# Patient Record
Sex: Female | Born: 2000
Health system: Southern US, Community
[De-identification: ages and names within clinical notes are randomized; demographics above are authoritative.]

## PROBLEM LIST (undated history)

## (undated) DIAGNOSIS — T4145XA Adverse effect of unspecified anesthetic, initial encounter: Secondary | ICD-10-CM

## (undated) DIAGNOSIS — F419 Anxiety disorder, unspecified: Secondary | ICD-10-CM

## (undated) DIAGNOSIS — T8859XA Other complications of anesthesia, initial encounter: Secondary | ICD-10-CM

## (undated) HISTORY — PX: WISDOM TOOTH EXTRACTION: SHX21

---
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
## 2018-07-14 DIAGNOSIS — R51 Headache: Secondary | ICD-10-CM | POA: Diagnosis not present

## 2018-07-14 DIAGNOSIS — H5203 Hypermetropia, bilateral: Secondary | ICD-10-CM | POA: Diagnosis not present

## 2019-11-15 DIAGNOSIS — T148XXA Other injury of unspecified body region, initial encounter: Secondary | ICD-10-CM | POA: Diagnosis not present

## 2019-11-17 DIAGNOSIS — M25632 Stiffness of left wrist, not elsewhere classified: Secondary | ICD-10-CM | POA: Diagnosis not present

## 2019-11-17 DIAGNOSIS — M25532 Pain in left wrist: Secondary | ICD-10-CM | POA: Diagnosis not present

## 2019-11-19 DIAGNOSIS — M25532 Pain in left wrist: Secondary | ICD-10-CM | POA: Diagnosis not present

## 2019-11-19 DIAGNOSIS — R29898 Other symptoms and signs involving the musculoskeletal system: Secondary | ICD-10-CM | POA: Diagnosis not present

## 2019-11-19 DIAGNOSIS — M25632 Stiffness of left wrist, not elsewhere classified: Secondary | ICD-10-CM | POA: Diagnosis not present

## 2019-11-30 DIAGNOSIS — R29898 Other symptoms and signs involving the musculoskeletal system: Secondary | ICD-10-CM | POA: Diagnosis not present

## 2019-11-30 DIAGNOSIS — M25532 Pain in left wrist: Secondary | ICD-10-CM | POA: Diagnosis not present

## 2019-11-30 DIAGNOSIS — M25632 Stiffness of left wrist, not elsewhere classified: Secondary | ICD-10-CM | POA: Diagnosis not present

## 2019-12-13 DIAGNOSIS — M25532 Pain in left wrist: Secondary | ICD-10-CM | POA: Diagnosis not present

## 2019-12-13 DIAGNOSIS — R29898 Other symptoms and signs involving the musculoskeletal system: Secondary | ICD-10-CM | POA: Diagnosis not present

## 2019-12-13 DIAGNOSIS — M25632 Stiffness of left wrist, not elsewhere classified: Secondary | ICD-10-CM | POA: Diagnosis not present

## 2019-12-21 DIAGNOSIS — M67432 Ganglion, left wrist: Secondary | ICD-10-CM | POA: Diagnosis not present

## 2019-12-28 ENCOUNTER — Other Ambulatory Visit: Payer: Self-pay | Admitting: Orthopedic Surgery

## 2019-12-28 DIAGNOSIS — R29898 Other symptoms and signs involving the musculoskeletal system: Secondary | ICD-10-CM | POA: Diagnosis not present

## 2019-12-28 DIAGNOSIS — M25632 Stiffness of left wrist, not elsewhere classified: Secondary | ICD-10-CM | POA: Diagnosis not present

## 2019-12-28 DIAGNOSIS — M67432 Ganglion, left wrist: Secondary | ICD-10-CM | POA: Diagnosis not present

## 2019-12-28 DIAGNOSIS — M25532 Pain in left wrist: Secondary | ICD-10-CM | POA: Diagnosis not present

## 2019-12-29 ENCOUNTER — Ambulatory Visit: Payer: BC Managed Care – PPO | Attending: Internal Medicine

## 2019-12-29 DIAGNOSIS — Z20822 Contact with and (suspected) exposure to covid-19: Secondary | ICD-10-CM

## 2019-12-30 LAB — NOVEL CORONAVIRUS, NAA: SARS-CoV-2, NAA: NOT DETECTED

## 2019-12-31 ENCOUNTER — Other Ambulatory Visit: Payer: Self-pay

## 2019-12-31 ENCOUNTER — Ambulatory Visit
Admission: RE | Admit: 2019-12-31 | Discharge: 2019-12-31 | Disposition: A | Discharge status: Home / Self Care | Payer: BC Managed Care – PPO | Source: Ambulatory Visit | Attending: Orthopedic Surgery | Admitting: Orthopedic Surgery

## 2019-12-31 ENCOUNTER — Other Ambulatory Visit: Payer: Self-pay | Admitting: Orthopedic Surgery

## 2019-12-31 DIAGNOSIS — M67432 Ganglion, left wrist: Secondary | ICD-10-CM

## 2020-06-30 DIAGNOSIS — H5203 Hypermetropia, bilateral: Secondary | ICD-10-CM | POA: Diagnosis not present

## 2020-06-30 DIAGNOSIS — M67432 Ganglion, left wrist: Secondary | ICD-10-CM | POA: Diagnosis not present

## 2020-07-17 ENCOUNTER — Other Ambulatory Visit: Payer: Self-pay

## 2020-07-17 ENCOUNTER — Encounter (HOSPITAL_BASED_OUTPATIENT_CLINIC_OR_DEPARTMENT_OTHER): Payer: Self-pay | Admitting: Orthopedic Surgery

## 2020-07-19 ENCOUNTER — Other Ambulatory Visit: Payer: Self-pay | Admitting: Orthopedic Surgery

## 2020-07-21 ENCOUNTER — Encounter (HOSPITAL_BASED_OUTPATIENT_CLINIC_OR_DEPARTMENT_OTHER): Payer: Self-pay | Admitting: Orthopedic Surgery

## 2020-07-21 ENCOUNTER — Other Ambulatory Visit (HOSPITAL_COMMUNITY)
Admission: RE | Admit: 2020-07-21 | Discharge: 2020-07-21 | Disposition: A | Discharge status: Home / Self Care | Payer: BC Managed Care – PPO | Source: Ambulatory Visit | Attending: Orthopedic Surgery | Admitting: Orthopedic Surgery

## 2020-07-21 DIAGNOSIS — Z20822 Contact with and (suspected) exposure to covid-19: Secondary | ICD-10-CM | POA: Diagnosis not present

## 2020-07-21 DIAGNOSIS — Z01812 Encounter for preprocedural laboratory examination: Secondary | ICD-10-CM | POA: Diagnosis not present

## 2020-07-21 LAB — SARS CORONAVIRUS 2 (TAT 6-24 HRS): SARS Coronavirus 2: NEGATIVE

## 2020-07-21 NOTE — Progress Notes (Signed)

## 2020-07-24 ENCOUNTER — Encounter (HOSPITAL_BASED_OUTPATIENT_CLINIC_OR_DEPARTMENT_OTHER)
Admission: RE | Disposition: A | Discharge status: Home / Self Care | Payer: Self-pay | Source: Home / Self Care | Attending: Orthopedic Surgery

## 2020-07-24 ENCOUNTER — Ambulatory Visit (HOSPITAL_BASED_OUTPATIENT_CLINIC_OR_DEPARTMENT_OTHER): Payer: BC Managed Care – PPO | Admitting: Anesthesiology

## 2020-07-24 ENCOUNTER — Encounter (HOSPITAL_BASED_OUTPATIENT_CLINIC_OR_DEPARTMENT_OTHER): Payer: Self-pay | Admitting: Orthopedic Surgery

## 2020-07-24 ENCOUNTER — Ambulatory Visit (HOSPITAL_BASED_OUTPATIENT_CLINIC_OR_DEPARTMENT_OTHER)
Admission: RE | Admit: 2020-07-24 | Discharge: 2020-07-24 | Disposition: A | Discharge status: Home / Self Care | Payer: BC Managed Care – PPO | Attending: Orthopedic Surgery | Admitting: Orthopedic Surgery

## 2020-07-24 ENCOUNTER — Other Ambulatory Visit: Payer: Self-pay

## 2020-07-24 DIAGNOSIS — M67432 Ganglion, left wrist: Secondary | ICD-10-CM | POA: Insufficient documentation

## 2020-07-24 DIAGNOSIS — Z79899 Other long term (current) drug therapy: Secondary | ICD-10-CM | POA: Insufficient documentation

## 2020-07-24 DIAGNOSIS — F419 Anxiety disorder, unspecified: Secondary | ICD-10-CM | POA: Insufficient documentation

## 2020-07-24 HISTORY — DX: Other complications of anesthesia, initial encounter: T88.59XA

## 2020-07-24 HISTORY — DX: Anxiety disorder, unspecified: F41.9

## 2020-07-24 HISTORY — PX: MASS EXCISION: SHX2000

## 2020-07-24 LAB — POCT PREGNANCY, URINE: Preg Test, Ur: NEGATIVE

## 2020-07-24 SURGERY — EXCISION MASS
Anesthesia: General | Site: Wrist | Laterality: Left

## 2020-07-24 MED ORDER — CEFAZOLIN SODIUM-DEXTROSE 2-4 GM/100ML-% IV SOLN
2.0000 g | INTRAVENOUS | Status: AC
  Administered 2020-07-24: 2 g via INTRAVENOUS

## 2020-07-24 MED ORDER — FENTANYL CITRATE (PF) 100 MCG/2ML IJ SOLN
INTRAMUSCULAR | Status: AC
  Filled 2020-07-24: qty 2

## 2020-07-24 MED ORDER — MIDAZOLAM HCL 2 MG/2ML IJ SOLN
INTRAMUSCULAR | Status: AC
  Filled 2020-07-24: qty 2

## 2020-07-24 MED ORDER — ONDANSETRON HCL 4 MG/2ML IJ SOLN
INTRAMUSCULAR | Status: AC
  Filled 2020-07-24: qty 2

## 2020-07-24 MED ORDER — LIDOCAINE HCL (CARDIAC) PF 100 MG/5ML IV SOSY
PREFILLED_SYRINGE | INTRAVENOUS | Status: DC | PRN
  Administered 2020-07-24: 40 mg via INTRAVENOUS

## 2020-07-24 MED ORDER — LIDOCAINE 4 % EX CREA
TOPICAL_CREAM | CUTANEOUS | Status: AC
  Filled 2020-07-24: qty 5

## 2020-07-24 MED ORDER — PROPOFOL 10 MG/ML IV BOLUS
INTRAVENOUS | Status: AC
  Filled 2020-07-24: qty 20

## 2020-07-24 MED ORDER — MIDAZOLAM HCL 2 MG/2ML IJ SOLN
2.0000 mg | Freq: Once | INTRAMUSCULAR | Status: AC
  Administered 2020-07-24: 2 mg via INTRAVENOUS

## 2020-07-24 MED ORDER — LACTATED RINGERS IV SOLN: INTRAVENOUS | Status: DC

## 2020-07-24 MED ORDER — ONDANSETRON HCL 4 MG/2ML IJ SOLN
INTRAMUSCULAR | Status: DC | PRN
  Administered 2020-07-24: 4 mg via INTRAVENOUS

## 2020-07-24 MED ORDER — PROPOFOL 10 MG/ML IV BOLUS
INTRAVENOUS | Status: DC | PRN
  Administered 2020-07-24: 150 mg via INTRAVENOUS

## 2020-07-24 MED ORDER — FENTANYL CITRATE (PF) 100 MCG/2ML IJ SOLN
INTRAMUSCULAR | Status: DC | PRN
  Administered 2020-07-24 (×2): 50 ug via INTRAVENOUS

## 2020-07-24 MED ORDER — KETOROLAC TROMETHAMINE 30 MG/ML IJ SOLN
INTRAMUSCULAR | Status: DC | PRN
  Administered 2020-07-24: 30 mg via INTRAVENOUS

## 2020-07-24 MED ORDER — CEFAZOLIN SODIUM-DEXTROSE 2-4 GM/100ML-% IV SOLN
INTRAVENOUS | Status: AC
  Filled 2020-07-24: qty 100

## 2020-07-24 MED ORDER — BUPIVACAINE HCL (PF) 0.25 % IJ SOLN
INTRAMUSCULAR | Status: DC | PRN
  Administered 2020-07-24: 6 mL

## 2020-07-24 MED ORDER — HYDROCODONE-ACETAMINOPHEN 5-325 MG PO TABS: ORAL_TABLET | ORAL | 0 refills | Status: DC

## 2020-07-24 SURGICAL SUPPLY — 59 items
APL PRP STRL LF DISP 70% ISPRP (MISCELLANEOUS) ×1
APL SKNCLS STERI-STRIP NONHPOA (GAUZE/BANDAGES/DRESSINGS) ×1
BENZOIN TINCTURE PRP APPL 2/3 (GAUZE/BANDAGES/DRESSINGS) ×1 IMPLANT
BLADE MINI RND TIP GREEN BEAV (BLADE) IMPLANT
BLADE SURG 15 STRL LF DISP TIS (BLADE) ×2 IMPLANT
BLADE SURG 15 STRL SS (BLADE) ×4
BNDG CMPR 9X4 STRL LF SNTH (GAUZE/BANDAGES/DRESSINGS)
BNDG COHESIVE 1X5 TAN STRL LF (GAUZE/BANDAGES/DRESSINGS) IMPLANT
BNDG COHESIVE 2X5 TAN STRL LF (GAUZE/BANDAGES/DRESSINGS) IMPLANT
BNDG CONFORM 2 STRL LF (GAUZE/BANDAGES/DRESSINGS) IMPLANT
BNDG ELASTIC 2X5.8 VLCR STR LF (GAUZE/BANDAGES/DRESSINGS) IMPLANT
BNDG ELASTIC 3X5.8 VLCR STR LF (GAUZE/BANDAGES/DRESSINGS) ×1 IMPLANT
BNDG ESMARK 4X9 LF (GAUZE/BANDAGES/DRESSINGS) IMPLANT
BNDG GAUZE 1X2.1 STRL (MISCELLANEOUS) IMPLANT
BNDG GAUZE ELAST 4 BULKY (GAUZE/BANDAGES/DRESSINGS) ×1 IMPLANT
BNDG PLASTER X FAST 3X3 WHT LF (CAST SUPPLIES) IMPLANT
BNDG PLSTR 9X3 FST ST WHT (CAST SUPPLIES)
CHLORAPREP W/TINT 26 (MISCELLANEOUS) ×2 IMPLANT
CORD BIPOLAR FORCEPS 12FT (ELECTRODE) ×2 IMPLANT
COVER BACK TABLE 60X90IN (DRAPES) ×2 IMPLANT
COVER MAYO STAND STRL (DRAPES) ×2 IMPLANT
COVER WAND RF STERILE (DRAPES) IMPLANT
CUFF TOURN SGL QUICK 18X4 (TOURNIQUET CUFF) ×2 IMPLANT
DRAPE EXTREMITY T 121X128X90 (DISPOSABLE) ×2 IMPLANT
DRAPE SURG 17X23 STRL (DRAPES) ×2 IMPLANT
DRSG PAD ABDOMINAL 8X10 ST (GAUZE/BANDAGES/DRESSINGS) ×1 IMPLANT
GAUZE SPONGE 4X4 12PLY STRL (GAUZE/BANDAGES/DRESSINGS) ×2 IMPLANT
GAUZE XEROFORM 1X8 LF (GAUZE/BANDAGES/DRESSINGS) ×2 IMPLANT
GLOVE BIO SURGEON STRL SZ7.5 (GLOVE) ×2 IMPLANT
GLOVE BIOGEL PI IND STRL 7.0 (GLOVE) IMPLANT
GLOVE BIOGEL PI IND STRL 8 (GLOVE) ×1 IMPLANT
GLOVE BIOGEL PI INDICATOR 7.0 (GLOVE) ×2
GLOVE BIOGEL PI INDICATOR 8 (GLOVE) ×1
GLOVE ECLIPSE 6.5 STRL STRAW (GLOVE) ×1 IMPLANT
GOWN STRL REUS W/ TWL LRG LVL3 (GOWN DISPOSABLE) ×1 IMPLANT
GOWN STRL REUS W/TWL LRG LVL3 (GOWN DISPOSABLE) ×2
GOWN STRL REUS W/TWL XL LVL3 (GOWN DISPOSABLE) ×2 IMPLANT
NDL HYPO 25X1 1.5 SAFETY (NEEDLE) ×1 IMPLANT
NEEDLE HYPO 25X1 1.5 SAFETY (NEEDLE) ×2 IMPLANT
NS IRRIG 1000ML POUR BTL (IV SOLUTION) ×2 IMPLANT
PACK BASIN DAY SURGERY FS (CUSTOM PROCEDURE TRAY) ×2 IMPLANT
PAD CAST 3X4 CTTN HI CHSV (CAST SUPPLIES) IMPLANT
PAD CAST 4YDX4 CTTN HI CHSV (CAST SUPPLIES) IMPLANT
PADDING CAST ABS 4INX4YD NS (CAST SUPPLIES)
PADDING CAST ABS COTTON 4X4 ST (CAST SUPPLIES) ×1 IMPLANT
PADDING CAST COTTON 3X4 STRL (CAST SUPPLIES)
PADDING CAST COTTON 4X4 STRL (CAST SUPPLIES)
STOCKINETTE 4X48 STRL (DRAPES) ×2 IMPLANT
STRIP CLOSURE SKIN 1/2X4 (GAUZE/BANDAGES/DRESSINGS) ×1 IMPLANT
SUT ETHILON 3 0 PS 1 (SUTURE) IMPLANT
SUT ETHILON 4 0 PS 2 18 (SUTURE) ×1 IMPLANT
SUT ETHILON 5 0 P 3 18 (SUTURE)
SUT MNCRL AB 4-0 PS2 18 (SUTURE) ×1 IMPLANT
SUT NYLON ETHILON 5-0 P-3 1X18 (SUTURE) IMPLANT
SUT VIC AB 4-0 P2 18 (SUTURE) ×1 IMPLANT
SYR BULB EAR ULCER 3OZ GRN STR (SYRINGE) ×2 IMPLANT
SYR CONTROL 10ML LL (SYRINGE) ×2 IMPLANT
TOWEL GREEN STERILE FF (TOWEL DISPOSABLE) ×4 IMPLANT
UNDERPAD 30X36 HEAVY ABSORB (UNDERPADS AND DIAPERS) ×2 IMPLANT

## 2020-07-24 NOTE — Anesthesia Procedure Notes (Signed)
Procedure Name: LMA Insertion Date/Time: 07/24/2020 2:11 PM Performed by: Burna Cash, CRNA Pre-anesthesia Checklist: Patient identified, Emergency Drugs available, Suction available and Patient being monitored Patient Re-evaluated:Patient Re-evaluated prior to induction Oxygen Delivery Method: Circle system utilized Preoxygenation: Pre-oxygenation with 100% oxygen Induction Type: IV induction Ventilation: Mask ventilation without difficulty LMA: LMA inserted LMA Size: 4.0 Number of attempts: 1 Airway Equipment and Method: Bite block Placement Confirmation: positive ETCO2 Tube secured with: Tape Dental Injury: Teeth and Oropharynx as per pre-operative assessment

## 2020-07-24 NOTE — H&P (Signed)
Sheila Brown is an 19 y.o. female.   Chief Complaint: left wrist ganglion HPI: 19 yo female with stiffness and pain left wrist x years.  Ultrasound confirms dorsal ganglion cyst.  She wishes to have removal of the cyst.    Allergies: No Known Allergies  Past Medical History:  Diagnosis Date  . Anxiety   . Complication of anesthesia    extreme anxiety r/t IV start. has had a procedure cancelled before due to hyperventilation etc    Past Surgical History:  Procedure Laterality Date  . WISDOM TOOTH EXTRACTION      Family History: History reviewed. No pertinent family history.  Social History:   reports that she has never smoked. She has never used smokeless tobacco. She reports current alcohol use. She reports that she does not use drugs.  Medications: Medications Prior to Admission  Medication Sig Dispense Refill  . LORazepam (ATIVAN) 2 MG tablet Take 2 mg by mouth every 6 (six) hours as needed for anxiety.    . Norgestimate-Ethinyl Estradiol Triphasic (TRI-LO-MARZIA) 0.18/0.215/0.25 MG-25 MCG tab Take 1 tablet by mouth daily.      Results for orders placed or performed during the hospital encounter of 07/24/20 (from the past 48 hour(s))  Pregnancy, urine POC     Status: None   Collection Time: 07/24/20 12:38 PM  Result Value Ref Range   Preg Test, Ur NEGATIVE NEGATIVE    Comment:        THE SENSITIVITY OF THIS METHODOLOGY IS >24 mIU/mL     No results found.   A comprehensive review of systems was negative.  Blood pressure 110/62, pulse 83, temperature 98.5 F (36.9 C), temperature source Oral, resp. rate 18, height 5\' 2"  (1.575 m), weight 53.5 kg, last menstrual period 07/08/2020, SpO2 100 %.  General appearance: alert, cooperative and appears stated age Head: Normocephalic, without obvious abnormality, atraumatic Neck: supple, symmetrical, trachea midline Cardio: regular rate and rhythm Resp: clear to auscultation bilaterally Extremities: Intact sensation and  capillary refill all digits.  +epl/fpl/io.  No wounds.  Pulses: 2+ and symmetric Skin: Skin color, texture, turgor normal. No rashes or lesions Neurologic: Grossly normal Incision/Wound: none  Assessment/Plan Left wrist dorsal ganglion cyst.  Non operative and operative treatment options have been discussed with the patient and patient wishes to proceed with operative treatment. Risks, benefits, and alternatives of surgery have been discussed and the patient agrees with the plan of care.   07/10/2020 07/24/2020, 1:48 PM

## 2020-07-24 NOTE — Anesthesia Preprocedure Evaluation (Signed)
Anesthesia Evaluation  Patient identified by MRN, date of birth, ID band Patient awake    Reviewed: Allergy & Precautions, NPO status , Patient's Chart, lab work & pertinent test results  Airway Mallampati: II  TM Distance: >3 FB Neck ROM: Full    Dental  (+) Teeth Intact, Dental Advisory Given   Pulmonary    breath sounds clear to auscultation       Cardiovascular  Rhythm:Regular Rate:Normal     Neuro/Psych    GI/Hepatic   Endo/Other    Renal/GU      Musculoskeletal   Abdominal   Peds  Hematology   Anesthesia Other Findings   Reproductive/Obstetrics                             Anesthesia Physical Anesthesia Plan  ASA: I  Anesthesia Plan: General   Post-op Pain Management:    Induction: Intravenous  PONV Risk Score and Plan: Ondansetron and Dexamethasone  Airway Management Planned: LMA  Additional Equipment:   Intra-op Plan:   Post-operative Plan:   Informed Consent: I have reviewed the patients History and Physical, chart, labs and discussed the procedure including the risks, benefits and alternatives for the proposed anesthesia with the patient or authorized representative who has indicated his/her understanding and acceptance.       Plan Discussed with: CRNA and Anesthesiologist  Anesthesia Plan Comments:         Anesthesia Quick Evaluation

## 2020-07-24 NOTE — Op Note (Signed)
NAME: Sheila Brown MEDICAL RECORD NO: [id number] DATE OF BIRTH: 2001/06/26 FACILITY: [facility] LOCATION: [facility] PHYSICIAN: Tami Ribas, MD   OPERATIVE REPORT   DATE OF PROCEDURE: 07/24/20    PREOPERATIVE DIAGNOSIS:   Left wrist dorsal ganglion cyst   POSTOPERATIVE DIAGNOSIS:   Wrist dorsal ganglion cyst   PROCEDURE:   Excision of left wrist dorsal ganglion cysts   SURGEON:  Betha Loa, M.D.   ASSISTANT: none   ANESTHESIA:  General   INTRAVENOUS FLUIDS:  Per anesthesia flow sheet.   ESTIMATED BLOOD LOSS:  Minimal.   COMPLICATIONS:  None.   SPECIMENS:   Left wrist ganglion cysts to pathology   TOURNIQUET TIME:    Total Tourniquet Time Documented: Upper Arm (Left) - 38 minutes Total: Upper Arm (Left) - 38 minutes    DISPOSITION:  Stable to PACU.   INDICATIONS: 19 year old female has had left wrist pain for several years.  Ultrasound shows small dorsal ganglion cyst.  She wishes to have this removed. Risks, benefits and alternatives of surgery were discussed including the risks of blood loss, infection, damage to nerves, vessels, tendons, ligaments, bone for surgery, need for additional surgery, complications with wound healing, continued pain, stiffness.  She voiced understanding of these risks and elected to proceed.  OPERATIVE COURSE:  After being identified preoperatively by myself,  the patient and I agreed on the procedure and site of the procedure.  The surgical site was marked.  Surgical consent had been signed. She was given IV antibiotics as preoperative antibiotic prophylaxis. She was transferred to the operating room and placed on the operating table in supine position with the Left upper extremity on an arm board.  General anesthesia was induced by the anesthesiologist.  Left upper extremity was prepped and draped in normal sterile orthopedic fashion.  A surgical pause was performed between the surgeons, anesthesia, and operating room staff and  all were in agreement as to the patient, procedure, and site of procedure.  Tourniquet at the proximal aspect of the extremity was inflated to 250 mmHg after exsanguination of the arm with an Esmarch bandage.    Transverse incision was made in the location of the ganglion on the dorsum of the wrist.  This is carried into subcutaneous tissues by spreading technique.  Bipolar electrocautery was used to obtain hemostasis.  The interval between the fourth and third dorsal compartments was followed.  There was patulousness to the capsule underneath the second dorsal compartment tendons that appeared cystic in nature.  There was also a rent in the capsule at the dorsum of the wrist underneath the fourth dorsal compartment and the posterior interosseous nerve.  There was also patulous cystic appearing tissue in this area.  The cystic appearing tissue was excised from both locations and submitted to pathology for examination.  At the location of the fourth dorsal compartment the midcarpal joint was noted underneath the cyst.  There was a denuded area on the dorsum of the hamate.  The visualized articular surfaces otherwise of the bones appeared without degeneration.  4-0 Vicryl suture was used in a figure-of-eight fashion to repair the rent in the dorsal capsule.  Good reapproximation was obtained.  The wound was copiously irrigated with sterile saline.  The 4-0 Vicryl suture was used in an inverted interrupted fashion in the subcutaneous tissues and skin was closed with a running subcuticular 4-0 Monocryl suture.  This was augmented with benzoin and Steri-Strips.  The area was injected with quarter percent plain  Marcaine to aid in postoperative analgesia.  It was dressed with sterile 4 x 4's and ABD and wrapped with Kerlix and Ace bandage.  The tourniquet was deflated at 38 minutes.  Fingertips were pink with brisk capillary refill after deflation of tourniquet.  The operative  drapes were broken down.  The patient was  awoken from anesthesia safely.  She was transferred back to the stretcher and taken to PACU in stable condition.  I will see her back in the office in 1 week for postoperative followup.  I will give her a prescription for Norco 5/325 1-2 tabs PO q6 hours prn pain, dispense # 20.   Betha Loa, MD Electronically signed, 07/24/20

## 2020-07-24 NOTE — Transfer of Care (Signed)
Immediate Anesthesia Transfer of Care Note  Patient: Sheila Brown  Procedure(s) Performed: EXCISION MASS LEFT WRIST (Left Wrist)  Patient Location: PACU  Anesthesia Type:General  Level of Consciousness: sedated  Airway & Oxygen Therapy: Patient Spontanous Breathing and Patient connected to face mask oxygen  Post-op Assessment: Report given to RN and Post -op Vital signs reviewed and stable  Post vital signs: Reviewed and stable  Last Vitals:  Vitals Value Taken Time  BP 95/61 07/24/20 1511  Temp    Pulse 67 07/24/20 1513  Resp 14 07/24/20 1513  SpO2 100 % 07/24/20 1513  Vitals shown include unvalidated device data.  Last Pain:  Vitals:   07/24/20 1247  TempSrc: Oral  PainSc: 0-No pain         Complications: No complications documented.

## 2020-07-24 NOTE — Discharge Instructions (Addendum)
[facility] Instructions Hand Surgery  Wound Care: Keep your hand elevated above the level of your heart.  Do not allow it to dangle by your side.  Keep the dressing dry and do not remove it unless your doctor advises you to do so.  He will usually change it at the time of your post-op visit.  Moving your fingers is advised to stimulate circulation but will depend on the site of your surgery.  If you have a splint applied, your doctor will advise you regarding movement.  Activity: Do not drive or operate machinery today.  Rest today and then you may return to your normal activity and work as indicated by your physician.  Diet:  Drink liquids today or eat a light diet.  You may resume a regular diet tomorrow.    General expectations: Pain for two to three days. Fingers may become slightly swollen.  Call your doctor if any of the following occur: Severe pain not relieved by pain medication. Elevated temperature. Dressing soaked with blood. Inability to move fingers. White or bluish color to fingers.   Post Anesthesia Home Care Instructions  Activity: Get plenty of rest for the remainder of the day. A responsible individual must stay with you for 24 hours following the procedure.  For the next 24 hours, DO NOT: -Drive a car -Advertising copywriter -Drink alcoholic beverages -Take any medication unless instructed by your physician -Make any legal decisions or sign important papers.  Meals: Start with liquid foods such as gelatin or soup. Progress to regular foods as tolerated. Avoid greasy, spicy, heavy foods. If nausea and/or vomiting occur, drink only clear liquids until the nausea and/or vomiting subsides. Call your physician if vomiting continues.  Special Instructions/Symptoms: Your throat may feel dry or sore from the anesthesia or the breathing tube placed in your throat during surgery. If this causes discomfort, gargle with warm salt water. The discomfort should disappear within  24 hours.  If you had a scopolamine patch placed behind your ear for the management of post- operative nausea and/or vomiting:  1. The medication in the patch is effective for 72 hours, after which it should be removed.  Wrap patch in a tissue and discard in the trash. Wash hands thoroughly with soap and water. 2. You may remove the patch earlier than 72 hours if you experience unpleasant side effects which may include dry mouth, dizziness or visual disturbances. 3. Avoid touching the patch. Wash your hands with soap and water after contact with the patch.    No Ibuprofen until 9 pm

## 2020-07-25 ENCOUNTER — Encounter (HOSPITAL_BASED_OUTPATIENT_CLINIC_OR_DEPARTMENT_OTHER): Payer: Self-pay | Admitting: Orthopedic Surgery

## 2020-07-25 LAB — SURGICAL PATHOLOGY

## 2020-07-25 NOTE — Anesthesia Postprocedure Evaluation (Signed)
Anesthesia Post Note  Patient: Sheila Brown  Procedure(s) Performed: EXCISION MASS LEFT WRIST (Left Wrist)     Patient location during evaluation: PACU Anesthesia Type: General Level of consciousness: awake and alert Pain management: pain level controlled Vital Signs Assessment: post-procedure vital signs reviewed and stable Respiratory status: spontaneous breathing, nonlabored ventilation, respiratory function stable and patient connected to nasal cannula oxygen Cardiovascular status: blood pressure returned to baseline and stable Postop Assessment: no apparent nausea or vomiting Anesthetic complications: no   No complications documented.  Last Vitals:  Vitals:   07/24/20 1530 07/24/20 1552  BP: 114/63 112/67  Pulse: 94 77  Resp: (!) 21 18  Temp:  36.8 C  SpO2: 100% 100%    Last Pain:  Vitals:   07/24/20 1552  TempSrc: Oral  PainSc: 2                  Kurstin Dimarzo COKER

## 2020-07-26 DIAGNOSIS — F4322 Adjustment disorder with anxiety: Secondary | ICD-10-CM | POA: Diagnosis not present

## 2020-08-10 DIAGNOSIS — F4322 Adjustment disorder with anxiety: Secondary | ICD-10-CM | POA: Diagnosis not present

## 2020-08-17 DIAGNOSIS — F4322 Adjustment disorder with anxiety: Secondary | ICD-10-CM | POA: Diagnosis not present

## 2020-08-22 DIAGNOSIS — F4322 Adjustment disorder with anxiety: Secondary | ICD-10-CM | POA: Diagnosis not present

## 2020-09-05 DIAGNOSIS — F4322 Adjustment disorder with anxiety: Secondary | ICD-10-CM | POA: Diagnosis not present

## 2020-09-20 DIAGNOSIS — M25532 Pain in left wrist: Secondary | ICD-10-CM | POA: Diagnosis not present

## 2020-09-21 DIAGNOSIS — F4322 Adjustment disorder with anxiety: Secondary | ICD-10-CM | POA: Diagnosis not present

## 2020-09-26 DIAGNOSIS — M25532 Pain in left wrist: Secondary | ICD-10-CM | POA: Diagnosis not present

## 2020-10-03 DIAGNOSIS — M25532 Pain in left wrist: Secondary | ICD-10-CM | POA: Diagnosis not present

## 2020-10-10 DIAGNOSIS — M25532 Pain in left wrist: Secondary | ICD-10-CM | POA: Diagnosis not present

## 2020-10-12 DIAGNOSIS — F4322 Adjustment disorder with anxiety: Secondary | ICD-10-CM | POA: Diagnosis not present

## 2020-10-17 DIAGNOSIS — M25532 Pain in left wrist: Secondary | ICD-10-CM | POA: Diagnosis not present

## 2020-10-19 DIAGNOSIS — F4322 Adjustment disorder with anxiety: Secondary | ICD-10-CM | POA: Diagnosis not present

## 2020-10-31 DIAGNOSIS — M25532 Pain in left wrist: Secondary | ICD-10-CM | POA: Diagnosis not present

## 2020-11-02 DIAGNOSIS — F4322 Adjustment disorder with anxiety: Secondary | ICD-10-CM | POA: Diagnosis not present

## 2020-11-14 DIAGNOSIS — M25532 Pain in left wrist: Secondary | ICD-10-CM | POA: Diagnosis not present

## 2020-11-14 DIAGNOSIS — F4322 Adjustment disorder with anxiety: Secondary | ICD-10-CM | POA: Diagnosis not present

## 2020-11-27 DIAGNOSIS — M25632 Stiffness of left wrist, not elsewhere classified: Secondary | ICD-10-CM | POA: Diagnosis not present

## 2020-11-27 DIAGNOSIS — M67432 Ganglion, left wrist: Secondary | ICD-10-CM | POA: Diagnosis not present

## 2020-11-29 DIAGNOSIS — F4322 Adjustment disorder with anxiety: Secondary | ICD-10-CM | POA: Diagnosis not present

## 2020-12-05 DIAGNOSIS — F4322 Adjustment disorder with anxiety: Secondary | ICD-10-CM | POA: Diagnosis not present

## 2021-02-02 IMAGING — US US EXTREM UP*L* LTD
1 series · 12 of 12 positions shown · non-contrast
Comparison: None.

CLINICAL DATA: Ganglion of the left wrist.

EXAM:
ULTRASOUND LEFT UPPER EXTREMITY LIMITED
TECHNIQUE: Ultrasound examination of the upper extremity soft tissues was
performed in the area of clinical concern.

[Series 1: us extrem up*left* ltd · 0.05mm/px · 12 of 12 slices shown]
[im 1/12]
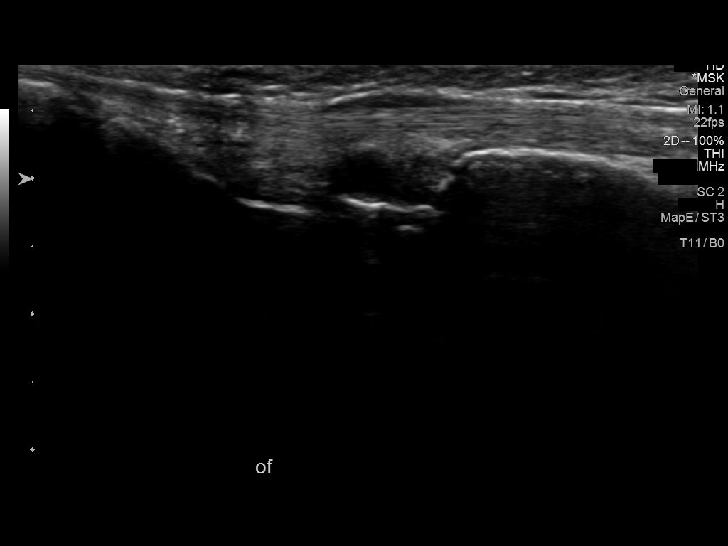
[im 2/12]
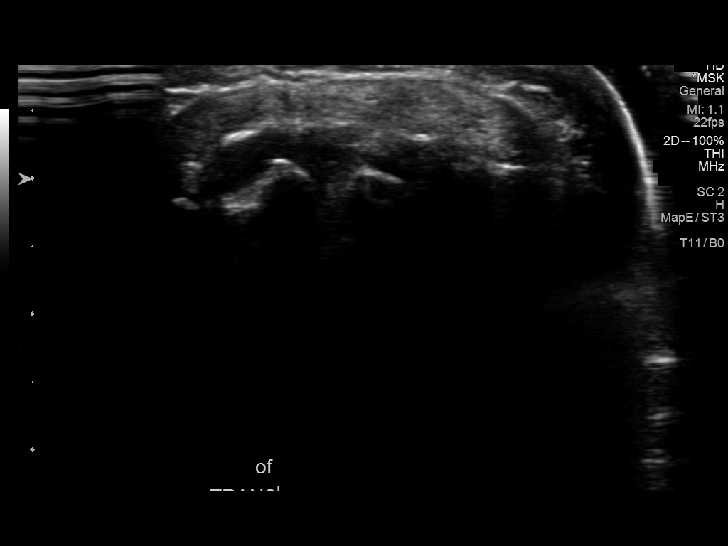
[im 3/12]
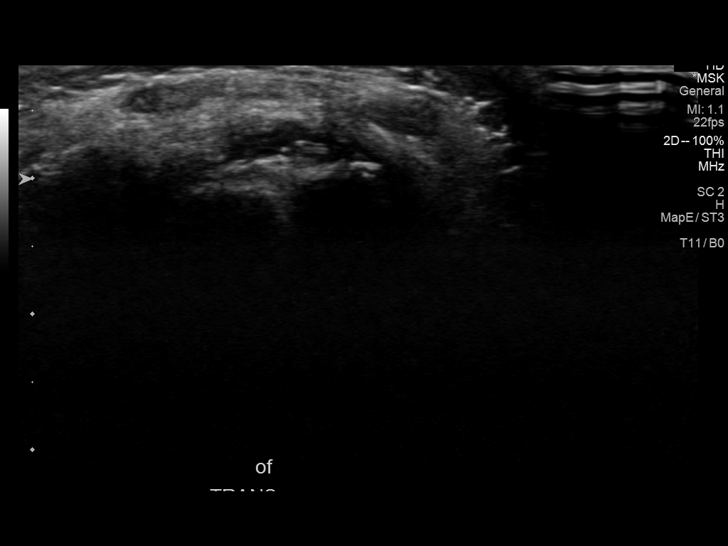
[im 4/12]
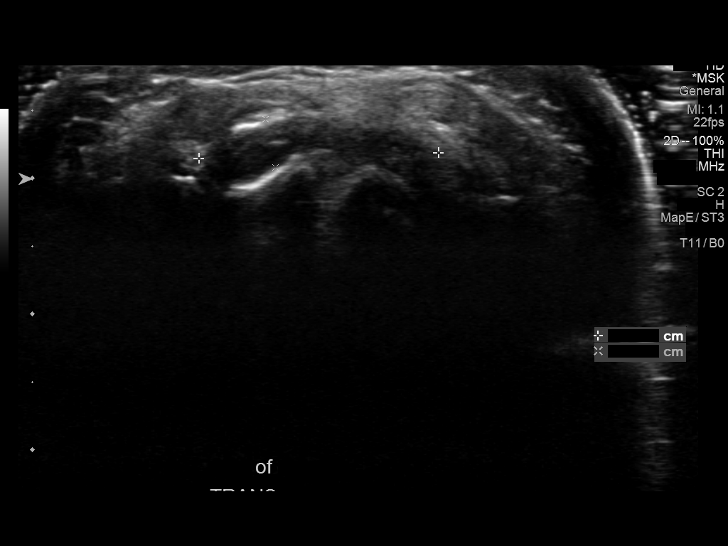
[im 5/12]
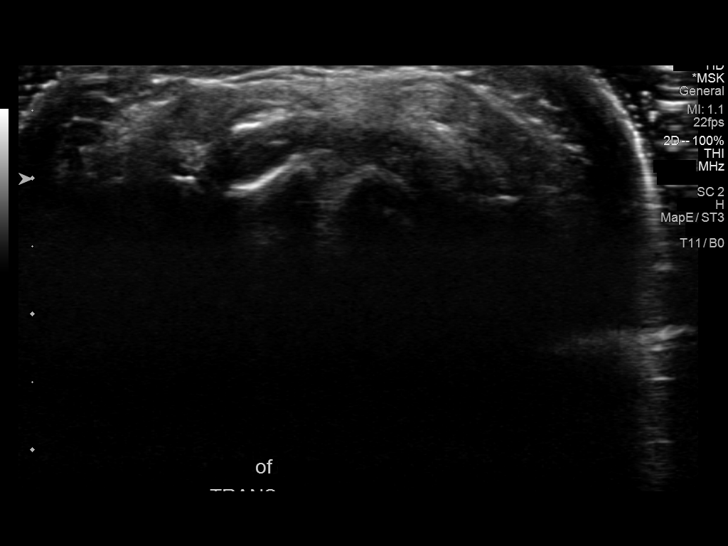
[im 6/12]
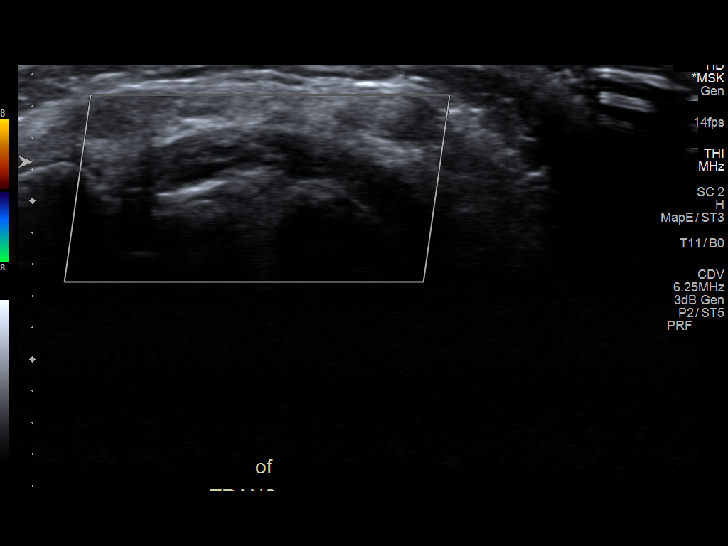
[im 7/12]
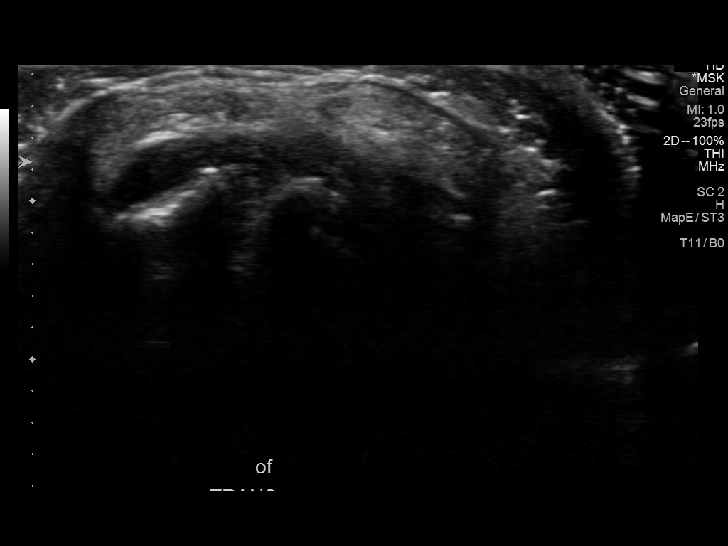
[im 8/12]
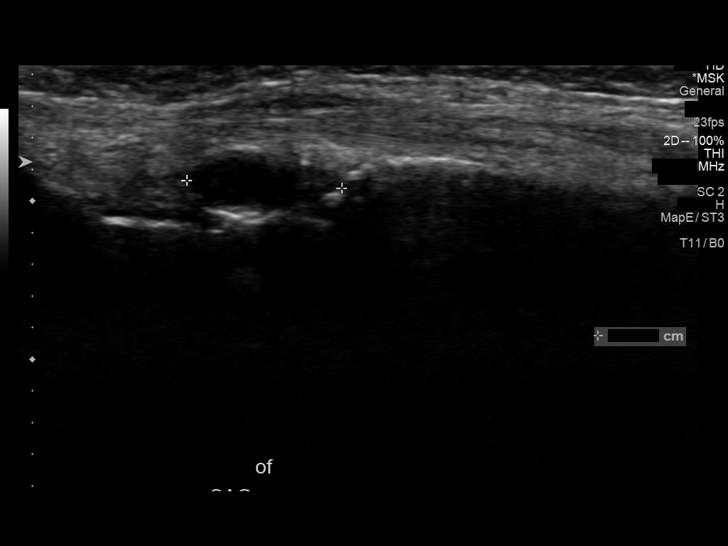
[im 9/12]
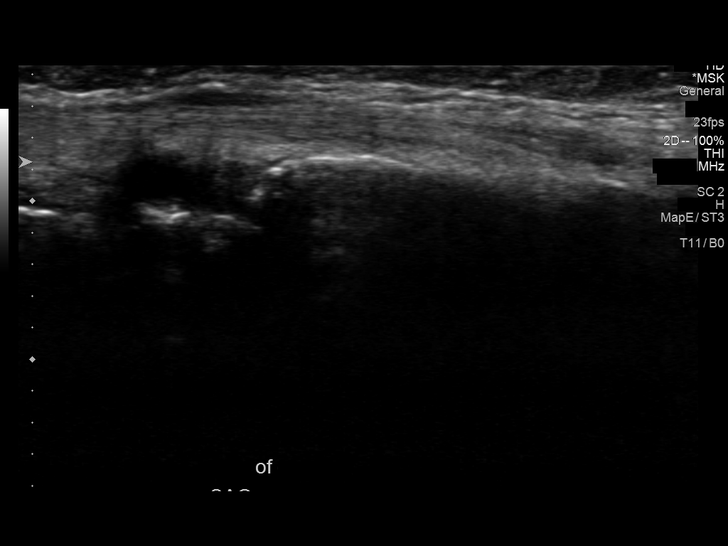
[im 10/12]
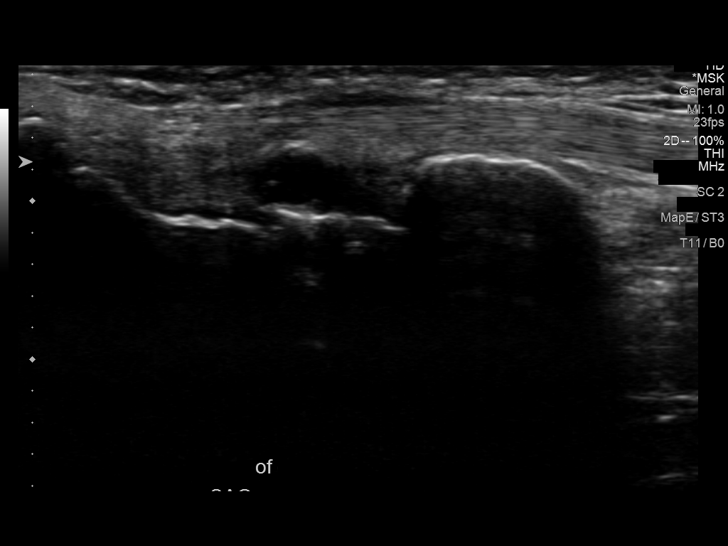
[im 11/12]
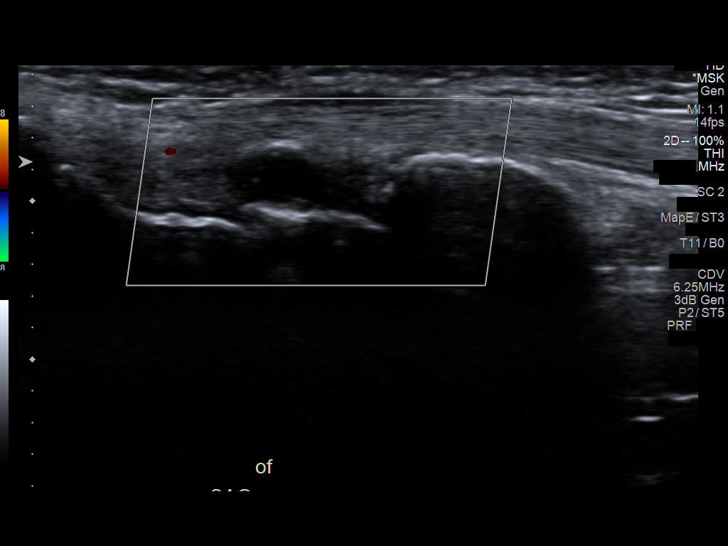
[im 12/12]
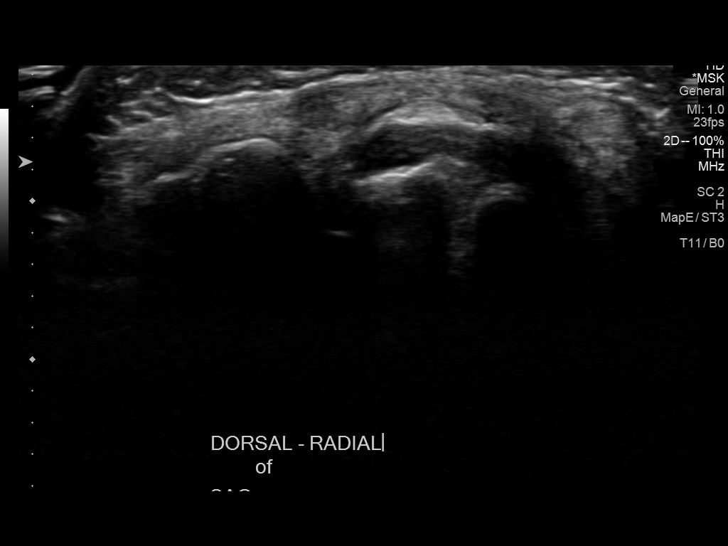

[12 of 12 positions shown; findings below may reference images not displayed]

FINDINGS: There is a 15 x 10 x 4 mm ganglion cyst at the dorsal aspect of the
wrist overlying the scapholunate joint. The dorsal aspect of the
scapholunate ligament is visible and is intact. This ganglion cyst
is deep to the extensor tendons. The overlying extensor tendons
appear normal.

There is no appreciable effusion in the distal radioulnar joint.
IMPRESSION: Ganglion cyst at the dorsal aspect of the scapholunate joint as
described. I cannot determine whether the ganglion is originating
from the distal radioulnar joint or from the dorsal aspect of the
midcarpal joint.

## 2021-02-02 IMAGING — US US EXTREM UP*L* LTD
1 series · 3 of 3 positions shown · non-contrast
Comparison: None.

CLINICAL DATA: Ganglion of the left wrist.

EXAM:
ULTRASOUND LEFT UPPER EXTREMITY LIMITED
TECHNIQUE: Ultrasound examination of the upper extremity soft tissues was
performed in the area of clinical concern.

[Series 1: us extrem up*left* ltd · 0.06mm/px · 3 of 3 slices shown]
[im 1/3]
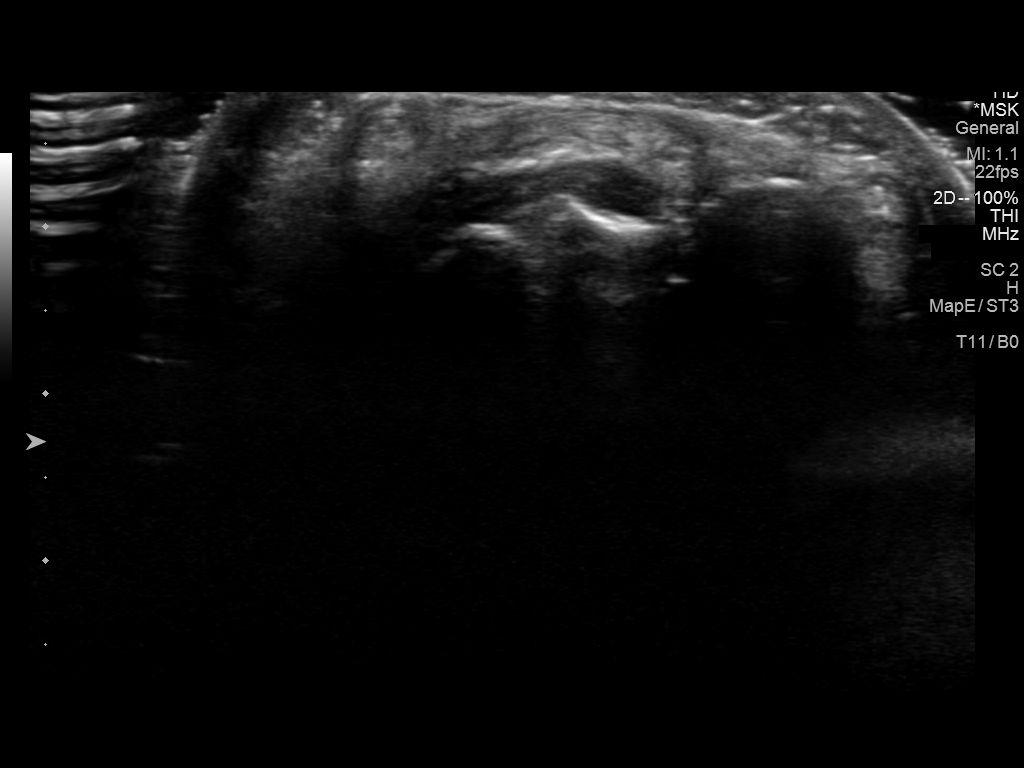
[im 2/3]
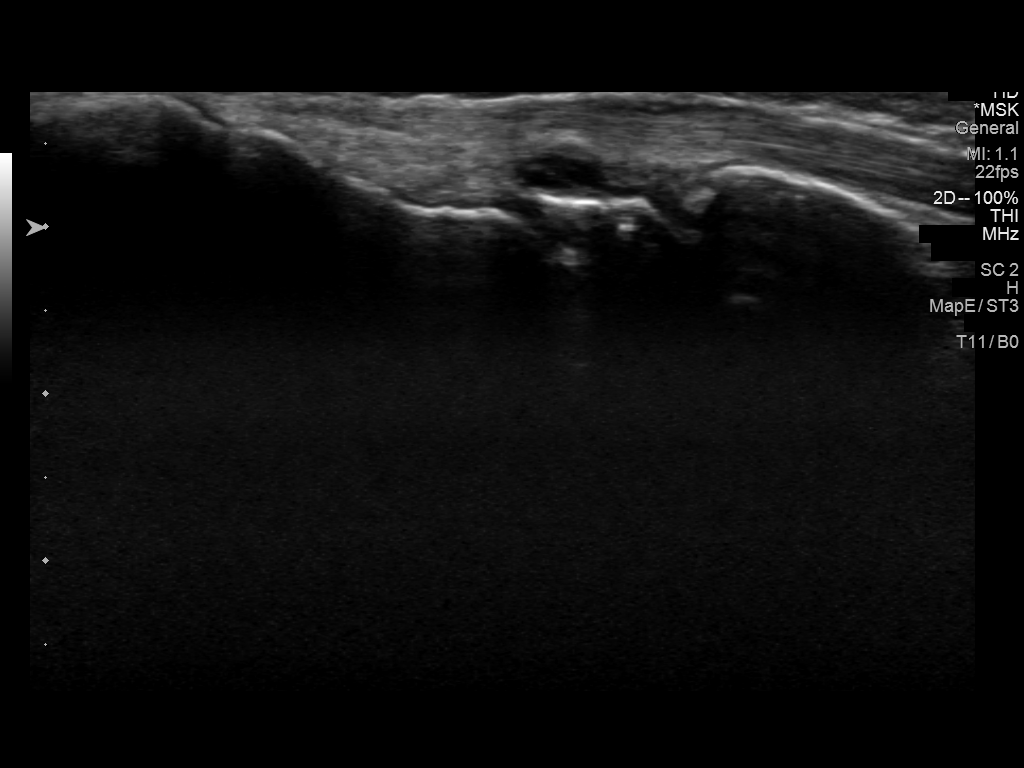
[im 3/3]
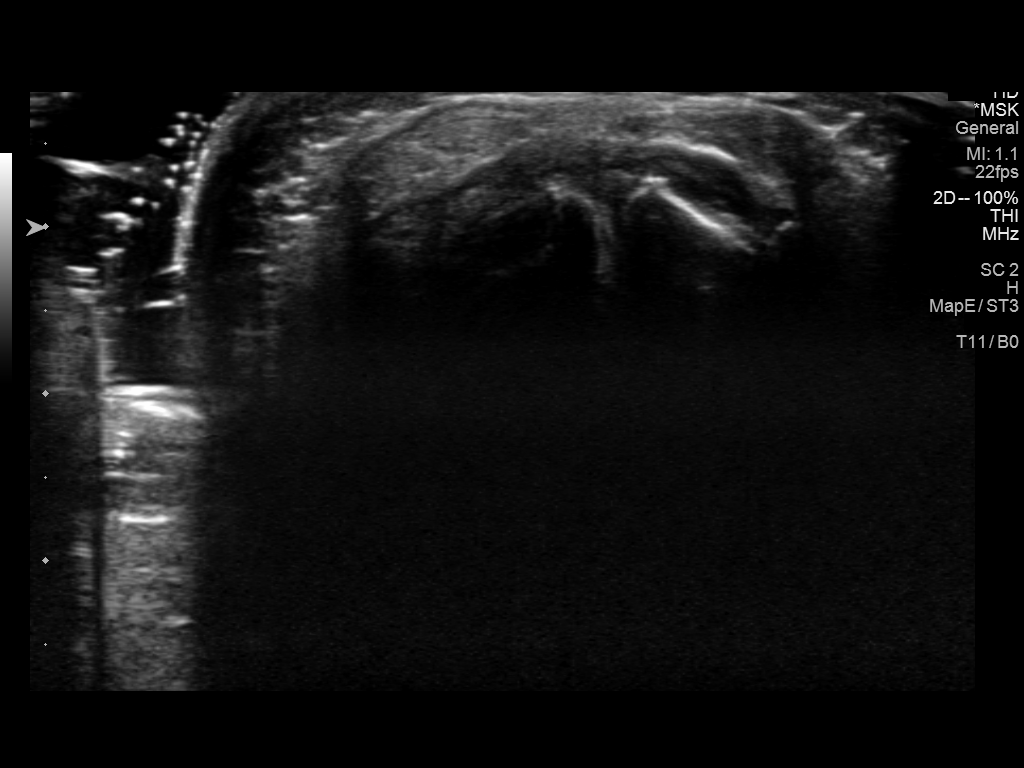

[3 of 3 positions shown; findings below may reference images not displayed]

FINDINGS: There is a 15 x 10 x 4 mm ganglion cyst at the dorsal aspect of the
wrist overlying the scapholunate joint. The dorsal aspect of the
scapholunate ligament is visible and is intact. This ganglion cyst
is deep to the extensor tendons. The overlying extensor tendons
appear normal.

There is no appreciable effusion in the distal radioulnar joint.
IMPRESSION: Ganglion cyst at the dorsal aspect of the scapholunate joint as
described. I cannot determine whether the ganglion is originating
from the distal radioulnar joint or from the dorsal aspect of the
midcarpal joint.

## 2021-08-07 ENCOUNTER — Other Ambulatory Visit: Payer: Self-pay

## 2021-08-07 ENCOUNTER — Encounter: Payer: Self-pay | Admitting: Behavioral Health

## 2021-08-07 ENCOUNTER — Ambulatory Visit (INDEPENDENT_AMBULATORY_CARE_PROVIDER_SITE_OTHER): Payer: BC Managed Care – PPO | Admitting: Behavioral Health

## 2021-08-07 VITALS — BP 140/77 | HR 72 | Ht 62.0 in | Wt 126.0 lb

## 2021-08-07 DIAGNOSIS — F411 Generalized anxiety disorder: Secondary | ICD-10-CM

## 2021-08-07 DIAGNOSIS — F410 Panic disorder [episodic paroxysmal anxiety] without agoraphobia: Secondary | ICD-10-CM | POA: Diagnosis not present

## 2021-08-07 MED ORDER — ALPRAZOLAM 0.5 MG PO TABS: ORAL_TABLET | ORAL | 0 refills | Status: DC

## 2021-08-07 MED ORDER — PROPRANOLOL HCL 10 MG PO TABS: 10.0000 mg | ORAL_TABLET | Freq: Three times a day (TID) | ORAL | 1 refills | Status: DC

## 2021-08-07 NOTE — Progress Notes (Signed)
[facility] MD/PA/NP Initial Note  08/07/2021 5:59 PM Sheila Brown  MRN:  [id number]  Chief Complaint:   HPI:   20 year old female presents to this office for initial visit and to establish care. She says that she struggled with anxiety and panic attack during her high school years. Although she has not had full blown panic attack in several years she has period of increased anxiety where she says it effects her performance and interaction with others. She says that her anxiety gets so bad before job interviews or medical treatments that it becomes paralyzing. She says that she is currently getting psychotherapy from [facility]. Says therapist recommended she seek provider to determine if she needed medication management. She says she is not inclined to take daily scheduled meds at this time because her anxiety is so intermittent.  She does fear having panic attacks gain in which she describes as trouble breathing and chest feeling heavy lasting about 15 minutes or so. She currently denies any mania. No psychosis, No SI/HI.  No prior medication trials     Visit Diagnosis:    ICD-10-CM   1. Generalized anxiety disorder  F41.1 propranolol (INDERAL) 10 MG tablet    ALPRAZolam (XANAX) 0.5 MG tablet    2. Panic attacks  F41.0 propranolol (INDERAL) 10 MG tablet    ALPRAZolam (XANAX) 0.5 MG tablet      Past Psychiatric History: Therapy [facility] Malling  Past Medical History:  Past Medical History:  Diagnosis Date   Anxiety    Complication of anesthesia    extreme anxiety r/t IV start. has had a procedure cancelled before due to hyperventilation etc    Past Surgical History:  Procedure Laterality Date   MASS EXCISION Left 07/24/2020   Procedure: EXCISION MASS LEFT WRIST;  Surgeon: Betha Loa, MD;  Location: [facility];  Service: Orthopedics;  Laterality: Left;   WISDOM TOOTH EXTRACTION      Family Psychiatric History:see chart   Family History:  Family  History  Problem Relation Age of Onset   Alcohol abuse Father    Depression Paternal Aunt    Anxiety disorder Paternal Aunt     Social History:  Social History   Socioeconomic History   Marital status: Single    Spouse name: Not on file   Number of children: 0   Years of education: 16   Highest education level: Not on file  Occupational History    Comment: Just graduated and looking for new job.  Tobacco Use   Smoking status: Never   Smokeless tobacco: Never  Substance and Sexual Activity   Alcohol use: Yes    Comment: occas   Drug use: Never   Sexual activity: Not Currently    Birth control/protection: Pill  Other Topics Concern   Not on file  Social History Narrative   Lives with mom and dad in [place]. 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 Likes to do  art. Crotchet.        Social Determinants of Health   Financial Resource Strain: Not on file  Food Insecurity: Not on file  Transportation Needs: Not on file  Physical Activity: Not on file  Stress: Not on file  Social Connections: Not on file    Allergies: No Known Allergies  Metabolic Disorder Labs: No results found for: HGBA1C, MPG No results found for: PROLACTIN No results found for: CHOL, TRIG, HDL, CHOLHDL, VLDL, LDLCALC No results found for: TSH  Therapeutic Level  Labs: No results found for: LITHIUM No results found for: VALPROATE No components found for:  CBMZ  Current Medications: Current Outpatient Medications  Medication Sig Dispense Refill   ALPRAZolam (XANAX) 0.5 MG tablet Take one tablet daily as needed for severe anxiety 15 tablet 0   Norgestimate-Ethinyl Estradiol Triphasic 0.18/0.215/0.25 MG-25 MCG tab Take 1 tablet by mouth daily.     propranolol (INDERAL) 10 MG tablet Take 1 tablet (10 mg total) by mouth 3 (three) times daily. 60 tablet 1   No current facility-administered medications for this visit.    Medication Side Effects: none  Orders placed  this visit:  No orders of the defined types were placed in this encounter.   Psychiatric Specialty Exam:  Review of Systems  Constitutional: Negative.   Cardiovascular:  Negative for palpitations.  Allergic/Immunologic: Negative.   Neurological: Negative.  Negative for tremors and weakness.   There were no vitals taken for this visit.There is no height or weight on file to calculate BMI.  General Appearance: Casual, Neat, and Well Groomed  Eye Contact:  Good  Speech:  Clear and Coherent  Volume:  Normal  Mood:  Anxious  Affect:  Anxious  Thought Process:  Coherent  Orientation:  Full (Time, Place, and Person)  Thought Content: Logical   Suicidal Thoughts:  No  Homicidal Thoughts:  No  Memory:  WNL  Judgement:  Good  Insight:  Good  Psychomotor Activity:  Normal  Concentration:  Concentration: Good  Recall:  Good  Fund of Knowledge: Good  Language: Good  Assets:  Desire for Improvement  ADL's:  Intact  Cognition: WNL  Prognosis:  Good   Screenings:  PHQ2-9    Flowsheet Row Office Visit from 08/07/2021 in [facility]  PHQ-2 Total Score 1       Receiving Psychotherapy:   Greater than 50% of face to face time with patient was spent on counseling and coordination of care. We discussed history of anxiety and panic attacks. Reviewed social history and discussed medication options.  We reviewed this plan.  Start Inderal 10 mg 3 times daily as needed Start xanax 0.5 mg, #15, once daily as needed only for severe uncontrolled anxiety.  To report worsening symptoms promptly Will follow up in 4 weeks to reassess. Provided emergency contact information  Discussed potential benefits, risk, and side effects of benzodiazepines to include potential risk of tolerance and dependence, as well as possible drowsiness.  Advised patient not to drive if experiencing drowsiness and to take lowest possible effective dose to minimize risk of dependence and tolerance.      Joan Flores, NP

## 2021-09-04 ENCOUNTER — Other Ambulatory Visit: Payer: Self-pay

## 2021-09-04 ENCOUNTER — Encounter: Payer: Self-pay | Admitting: Behavioral Health

## 2021-09-04 ENCOUNTER — Ambulatory Visit (INDEPENDENT_AMBULATORY_CARE_PROVIDER_SITE_OTHER): Payer: BC Managed Care – PPO | Admitting: Behavioral Health

## 2021-09-04 DIAGNOSIS — F411 Generalized anxiety disorder: Secondary | ICD-10-CM | POA: Diagnosis not present

## 2021-09-04 DIAGNOSIS — F410 Panic disorder [episodic paroxysmal anxiety] without agoraphobia: Secondary | ICD-10-CM | POA: Diagnosis not present
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
## 2021-09-12 ENCOUNTER — Other Ambulatory Visit: Payer: Self-pay | Admitting: Behavioral Health

## 2021-09-12 DIAGNOSIS — F411 Generalized anxiety disorder: Secondary | ICD-10-CM

## 2021-09-12 DIAGNOSIS — F410 Panic disorder [episodic paroxysmal anxiety] without agoraphobia: Secondary | ICD-10-CM

## 2021-10-02 ENCOUNTER — Ambulatory Visit: Payer: BC Managed Care – PPO | Admitting: Behavioral Health

## 2021-10-05 ENCOUNTER — Ambulatory Visit: Payer: BC Managed Care – PPO | Admitting: Behavioral Health

## 2021-10-05 ENCOUNTER — Encounter: Payer: Self-pay | Admitting: Behavioral Health

## 2021-10-05 ENCOUNTER — Other Ambulatory Visit: Payer: Self-pay

## 2021-10-05 DIAGNOSIS — F410 Panic disorder [episodic paroxysmal anxiety] without agoraphobia: Secondary | ICD-10-CM

## 2021-10-05 DIAGNOSIS — F411 Generalized anxiety disorder: Secondary | ICD-10-CM | POA: Diagnosis not present

## 2021-10-05 NOTE — Progress Notes (Signed)
[facility]  Patient ID: Sheila Brown,  MRN: [id number]  PCP: Patient, No Pcp Per (Inactive)  Date of Evaluation: 10/05/2021 Time spent:20 minutes  Chief Complaint:  Chief Complaint   Anxiety; Panic Attack; Follow-up     HISTORY/CURRENT STATUS: HPI 20 year old female presents to this office for follow up and medication management. She says that she was doing very well and only use medication sparingly until this week. She was able to get more information about HPV infection from her OB/GYN. Says that she understands the treatment plan now. Says that she is currently getting psychotherapy from [facility]. No panic attacks since last visit. Reports anxiety 3/10 today due to medical information. Depression 4/10. Sleeping 7-8 hours . She does not want to adjust medication at this time. She currently denies any mania. No psychosis, No SI/HI. She would like a 3 month follow up.    No prior medication trials        Individual Medical History/ Review of Systems: Changes? :No   Allergies: Patient has no known allergies.  Current Medications:  Current Outpatient Medications:    ALPRAZolam (XANAX) 0.5 MG tablet, Take one tablet daily as needed for severe anxiety, Disp: 15 tablet, Rfl: 0   Norgestimate-Ethinyl Estradiol Triphasic 0.18/0.215/0.25 MG-25 MCG tab, Take 1 tablet by mouth daily., Disp: , Rfl:    propranolol (INDERAL) 10 MG tablet, TAKE ONE TABLET BY MOUTH THREE TIMES A DAY, Disp: 90 tablet, Rfl: 1 Medication Side Effects: none  Family Medical/ Social History: Changes? No  MENTAL HEALTH EXAM:  There were no vitals taken for this visit.There is no height or weight on file to calculate BMI.  General Appearance: Casual, Neat, and Well Groomed  Eye Contact:  Good  Speech:  Clear and Coherent  Volume:  Normal  Mood:  Anxious  Affect:  Anxious  Thought Process:  Coherent  Orientation:  Full (Time, Place, and Person)  Thought Content: Logical   Suicidal  Thoughts:  No  Homicidal Thoughts:  No  Memory:  WNL  Judgement:  Good  Insight:  Good  Psychomotor Activity:  Normal  Concentration:  Concentration: Good  Recall:  Good  Fund of Knowledge: Good  Language: Good  Assets:  Desire for Improvement  ADL's:  Intact  Cognition: WNL  Prognosis:  Good    DIAGNOSES:    ICD-10-CM   1. Generalized anxiety disorder  F41.1     2. Panic attacks  F41.0       Receiving Psychotherapy: yes   RECOMMENDATIONS:   Greater than 50% of  20 min face to face time with patient was spent on counseling and coordination of care. We reviewed recent improvements with anxiety and panic attack. We discussed her recent improvement with anxiety since learning of HPV. She was able to ask more detailed questions from her OB/GYN that reassured her to some degree. She does not want to adjust or change medication at this time.   Continue Inderal 10 mg 3 times daily as needed Continue xanax 0.5 mg, #15, once daily as needed only for severe uncontrolled anxiety.  To report worsening symptoms promptly Will follow up in 4 weeks to reassess. Provided emergency contact information   Discussed potential benefits, risk, and side effects of benzodiazepines to include potential risk of tolerance and dependence, as well as possible drowsiness.  Advised patient not to drive if experiencing drowsiness and to take lowest possible effective dose to minimize risk of dependence and tolerance.  Elwanda Brooklyn, NP

## 2021-11-12 ENCOUNTER — Other Ambulatory Visit: Payer: Self-pay | Admitting: Behavioral Health

## 2021-11-12 DIAGNOSIS — F411 Generalized anxiety disorder: Secondary | ICD-10-CM

## 2021-11-12 DIAGNOSIS — F410 Panic disorder [episodic paroxysmal anxiety] without agoraphobia: Secondary | ICD-10-CM

## 2022-01-04 ENCOUNTER — Encounter: Payer: Self-pay | Admitting: Behavioral Health

## 2022-01-04 ENCOUNTER — Other Ambulatory Visit: Payer: Self-pay

## 2022-01-04 ENCOUNTER — Ambulatory Visit: Payer: BC Managed Care – PPO | Admitting: Behavioral Health

## 2022-01-04 DIAGNOSIS — F411 Generalized anxiety disorder: Secondary | ICD-10-CM | POA: Diagnosis not present

## 2022-01-04 DIAGNOSIS — F410 Panic disorder [episodic paroxysmal anxiety] without agoraphobia: Secondary | ICD-10-CM | POA: Diagnosis not present

## 2022-01-04 NOTE — Progress Notes (Signed)
[facility]  Patient ID: Sheila Brown,  MRN: [id number]  PCP: Patient, No Pcp Per (Inactive)  Date of Evaluation: 01/04/2022 Time spent:30 minutes  Chief Complaint:  Chief Complaint   Anxiety; Panic Attack; Medication Refill; Follow-up     HISTORY/CURRENT STATUS: HPI 21 year old female presents to this office for follow up and medication management. She says that she was doing very well and only use medication sparingly. Says that she is currently getting psychotherapy from [facility]. No panic attacks since last Oct. 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 Reports anxiety 2/10 today Depression 1/10. Sleeping 7-8 hours . She does not want to adjust medication at this time. She currently denies any mania. No psychosis, No SI/HI. She would like a 3 month follow up.    No prior medication trials  Individual Medical History/ Review of Systems: Changes? :No   Allergies: Patient has no known allergies.  Current Medications:  Current Outpatient Medications:    ALPRAZolam (XANAX) 0.5 MG tablet, Take one tablet daily as needed for severe anxiety, Disp: 15 tablet, Rfl: 0   Norgestimate-Ethinyl Estradiol Triphasic 0.18/0.215/0.25 MG-25 MCG tab, Take 1 tablet by mouth daily., Disp: , Rfl:    propranolol (INDERAL) 10 MG tablet, TAKE ONE TABLET BY MOUTH THREE TIMES A DAY, Disp: 90 tablet, Rfl: 1 Medication Side Effects: none  Family Medical/ Social History: Changes? No  MENTAL HEALTH EXAM:  There were no vitals taken for this visit.There is no height or weight on file to calculate BMI.  General Appearance: Casual and Neat  Eye Contact:  Good  Speech:  Clear and Coherent  Volume:  Normal  Mood:  NA  Affect:  Appropriate  Thought Process:  Coherent  Orientation:  Full (Time, Place, and Person)  Thought Content: Logical   Suicidal Thoughts:  No  Homicidal Thoughts:  No  Memory:  WNL  Judgement:  Good  Insight:  Good  Psychomotor Activity:  Normal   Concentration:  Concentration: Good  Recall:  Good  Fund of Knowledge: Good  Language: Good  Assets:  Desire for Improvement  ADL's:  Intact  Cognition: WNL  Prognosis:  Good    DIAGNOSES:    ICD-10-CM   1. Generalized anxiety disorder  F41.1     2. Panic attacks  F41.0       Receiving Psychotherapy: No    RECOMMENDATIONS:   Greater than 50% of  30 min face to face time with patient was spent on counseling and coordination of care. We reviewed recent improvements with anxiety and panic attack. We discussed her recent improvement with anxiety since learning of HPV.  She is dating new boyfriend now for 5 months and says she is very happy right now. She does not want to adjust or change medication at this time.    Continue Inderal 10 mg 3 times daily as needed Continue xanax 0.5 mg, #15, once daily as needed only for severe uncontrolled anxiety.  To report worsening symptoms promptly Will follow up in 4 weeks to reassess. Provided emergency contact information   Discussed potential benefits, risk, and side effects of benzodiazepines to include potential risk of tolerance and dependence, as well as possible drowsiness.  Advised patient not to drive if experiencing drowsiness and to take lowest possible effective dose to minimize risk of dependence and tolerance.        Elwanda Brooklyn, NP

## 2022-07-04 ENCOUNTER — Encounter: Payer: Self-pay | Admitting: Behavioral Health

## 2022-07-04 ENCOUNTER — Ambulatory Visit: Payer: BC Managed Care – PPO | Admitting: Behavioral Health

## 2022-07-04 DIAGNOSIS — F411 Generalized anxiety disorder: Secondary | ICD-10-CM | POA: Diagnosis not present

## 2022-07-04 DIAGNOSIS — F410 Panic disorder [episodic paroxysmal anxiety] without agoraphobia: Secondary | ICD-10-CM | POA: Diagnosis not present

## 2022-07-04 MED ORDER — PROPRANOLOL HCL 10 MG PO TABS: 10.0000 mg | ORAL_TABLET | Freq: Three times a day (TID) | ORAL | 1 refills | Status: DC

## 2022-07-04 NOTE — Progress Notes (Signed)
[facility]  Patient ID: Sheila Brown,  MRN: [id number]  PCP: Patient, No Pcp Per  Date of Evaluation: 07/04/2022 Time spent:20 minutes  Chief Complaint:   21 year old female presents to this office for follow up and medication management. She says that she was doing very well and only use medication sparingly. Says that she is currently getting psychotherapy from [facility]. No panic attacks since last Oct. 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 Reports anxiety 2/10 today Depression 1/10. Sleeping 7-8 hours . She does not want to adjust medication at this time. She currently denies any mania. No psychosis, No SI/HI. She would like a 6 month follow up.    No prior medication trials     HISTORY/CURRENT STATUS: HPI  Individual Medical History/ Review of Systems: Changes? :No   Allergies: Patient has no known allergies.  Current Medications:  Current Outpatient Medications:    ALPRAZolam (XANAX) 0.5 MG tablet, Take one tablet daily as needed for severe anxiety, Disp: 15 tablet, Rfl: 0   Norgestimate-Ethinyl Estradiol Triphasic 0.18/0.215/0.25 MG-25 MCG tab, Take 1 tablet by mouth daily., Disp: , Rfl:    propranolol (INDERAL) 10 MG tablet, Take 1 tablet (10 mg total) by mouth 3 (three) times daily., Disp: 90 tablet, Rfl: 1 Medication Side Effects: none  Family Medical/ Social History: Changes? No  MENTAL HEALTH EXAM:  There were no vitals taken for this visit.There is no height or weight on file to calculate BMI.  General Appearance: Casual  Eye Contact:  Good  Speech:  Clear and Coherent  Volume:  Normal  Mood:  Anxious  Affect:  Appropriate  Thought Process:  Coherent  Orientation:  Full (Time, Place, and Person)  Thought Content: Logical   Suicidal Thoughts:  No  Homicidal Thoughts:  No  Memory:  WNL  Judgement:  Good  Insight:  Good  Psychomotor Activity:  Normal  Concentration:  Concentration: Good  Recall:  Good  Fund of Knowledge:  Good  Language: Good  Assets:  Desire for Improvement  ADL's:  Intact  Cognition: WNL  Prognosis:  Good    DIAGNOSES:    ICD-10-CM   1. Generalized anxiety disorder  F41.1 propranolol (INDERAL) 10 MG tablet    2. Panic attacks  F41.0 propranolol (INDERAL) 10 MG tablet      Receiving Psychotherapy: No    RECOMMENDATIONS:    Greater than 50% of  30 min face to face time with patient was spent on counseling and coordination of care. We reviewed recent improvements with anxiety and panic attack. She has new job in [employer]. Has some increased but normal level of anxiety with this.    Continue Inderal 10 mg 3 times daily as needed Continue xanax 0.5 mg, #15, once daily as needed only for severe uncontrolled anxiety.  To report worsening symptoms promptly Will follow up in 6 weeks to reassess. Provided emergency contact information Pt on [facility]   Discussed potential benefits, risk, and side effects of benzodiazepines to include potential risk of tolerance and dependence, as well as possible drowsiness.  Advised patient not to drive if experiencing drowsiness and to take lowest possible effective dose to minimize risk of dependence and tolerance.        Joan Flores, NP

## 2023-01-03 ENCOUNTER — Ambulatory Visit (INDEPENDENT_AMBULATORY_CARE_PROVIDER_SITE_OTHER): Payer: BC Managed Care – PPO | Admitting: Behavioral Health

## 2023-01-03 ENCOUNTER — Encounter: Payer: Self-pay | Admitting: Behavioral Health

## 2023-01-03 VITALS — Wt 127.0 lb

## 2023-01-03 DIAGNOSIS — F411 Generalized anxiety disorder: Secondary | ICD-10-CM

## 2023-01-03 DIAGNOSIS — F410 Panic disorder [episodic paroxysmal anxiety] without agoraphobia: Secondary | ICD-10-CM

## 2023-01-03 NOTE — Progress Notes (Signed)
[facility]  Patient ID: Jessice Madill,  MRN: [id number]  PCP: Patient, No Pcp Per  Date of Evaluation: 01/03/2023 Time spent:30 minutes  Chief Complaint:  Chief Complaint   Anxiety; Follow-up; Medication Problem; Patient Education; Stress; Sexual Problem     HISTORY/CURRENT STATUS: HPI  22 year old female presents to this office for follow up and medication management. She says that she was doing very well and only use medication sparingly. Says that she is currently getting psychotherapy from [facility]. She says that she has been disturbed since early January since she discovered she was positive for Chlamydia infection. Says she was upset because she has not been with another person other than her long term boyfriend. Says he tested negative.  Reports anxiety 2/10 today Depression 1/10. Sleeping 7-8 hours . She does not want to adjust medication at this time. She currently denies any mania. No psychosis, No SI/HI. She would like a 6 month follow up.    No prior medication trials     Individual Medical History/ Review of Systems: Changes? :No   Allergies: Patient has no known allergies.  Current Medications:  Current Outpatient Medications:    ALPRAZolam (XANAX) 0.5 MG tablet, Take one tablet daily as needed for severe anxiety, Disp: 15 tablet, Rfl: 0   Norgestimate-Ethinyl Estradiol Triphasic 0.18/0.215/0.25 MG-25 MCG tab, Take 1 tablet by mouth daily., Disp: , Rfl:    propranolol (INDERAL) 10 MG tablet, Take 1 tablet (10 mg total) by mouth 3 (three) times daily., Disp: 90 tablet, Rfl: 1 Medication Side Effects: none  Family Medical/ Social History: Changes? No  MENTAL HEALTH EXAM:  There were no vitals taken for this visit.There is no height or weight on file to calculate BMI.  General Appearance: Casual, Neat, and Well Groomed  Eye Contact:  Good  Speech:  Clear and Coherent  Volume:  Normal  Mood:  Anxious  Affect:  Appropriate  Thought Process:   Coherent  Orientation:  Full (Time, Place, and Person)  Thought Content: Logical   Suicidal Thoughts:  No  Homicidal Thoughts:  No  Memory:  WNL  Judgement:  Good  Insight:  Good  Psychomotor Activity:  Normal  Concentration:  Concentration: Good  Recall:  Good  Fund of Knowledge: Good  Language: Good  Assets:  Desire for Improvement  ADL's:  Intact  Cognition: WNL  Prognosis:  Good    DIAGNOSES: No diagnosis found.  Receiving Psychotherapy: No    RECOMMENDATIONS:   Greater than 50% of  30 min face to face time with patient was spent on counseling and coordination of care.  We discussed her concerns about recent positive test for STI. She tested positive for Chlamydia. Discussed her concerns because she says she has only been with her long term boyfriend but he tested negative. She was treated with ABX and now clear.  She has new job in [place] that is going very well now.  Educated her on how STI are transmitted and risk. Advised her on how to stay safe and recommended further testing in 6 months due to long term separation from boyfriend who is in [employer]. Counseled her on how to have this conversation with her boyfriend.    Continue Inderal 10 mg 3 times daily as needed Continue xanax 0.5 mg, #15, once daily as needed only for severe uncontrolled anxiety.  To report worsening symptoms promptly Will follow up in 6 months to reassess. Provided emergency contact information Pt on [facility]  Discussed potential benefits, risk, and side effects of benzodiazepines to include potential risk of tolerance and dependence, as well as possible drowsiness.  Advised patient not to drive if experiencing drowsiness and to take lowest possible effective dose to minimize risk of dependence and tolerance.    Elwanda Brooklyn, NP

## 2023-07-04 ENCOUNTER — Ambulatory Visit (INDEPENDENT_AMBULATORY_CARE_PROVIDER_SITE_OTHER): Payer: BC Managed Care – PPO | Admitting: Behavioral Health

## 2023-07-04 ENCOUNTER — Encounter: Payer: Self-pay | Admitting: Behavioral Health

## 2023-07-04 DIAGNOSIS — F411 Generalized anxiety disorder: Secondary | ICD-10-CM

## 2023-07-04 DIAGNOSIS — F410 Panic disorder [episodic paroxysmal anxiety] without agoraphobia: Secondary | ICD-10-CM | POA: Diagnosis not present

## 2023-07-04 MED ORDER — PROPRANOLOL HCL 10 MG PO TABS: 10.0000 mg | ORAL_TABLET | Freq: Three times a day (TID) | ORAL | 1 refills | Status: DC

## 2023-07-04 MED ORDER — ALPRAZOLAM 0.5 MG PO TABS: ORAL_TABLET | ORAL | 0 refills | Status: DC

## 2023-07-04 NOTE — Progress Notes (Signed)
[facility]  Patient ID: Sheila Brown,  MRN: [id number]  PCP: Patient, No Pcp Per  Date of Evaluation: 07/04/2023 Time spent:30 minutes  Chief Complaint:  Chief Complaint   Anxiety; Depression; Follow-up; Patient Education; Medication Refill     HISTORY/CURRENT STATUS: HPI  22 year old female presents to this office for follow up and medication management. Excited that she got promotion at work. She says that she was doing very well and only use medication sparingly. Says that she is currently getting psychotherapy from [facility]. Has been doing well in relationship with boyfriend. Talking of engagement.  Reports anxiety 2/10 today Depression 1/10. Sleeping 7-8 hours . She does not want to adjust medication at this time. She currently denies any mania. No psychosis, No SI/HI. She would like a 6 month follow up.    No prior medication trials      Individual Medical History/ Review of Systems: Changes? :No   Allergies: Patient has no known allergies.  Current Medications:  Current Outpatient Medications:    ALPRAZolam (XANAX) 0.5 MG tablet, Take one tablet daily as needed for severe anxiety, Disp: 15 tablet, Rfl: 0   Norgestimate-Ethinyl Estradiol Triphasic 0.18/0.215/0.25 MG-25 MCG tab, Take 1 tablet by mouth daily., Disp: , Rfl:    propranolol (INDERAL) 10 MG tablet, Take 1 tablet (10 mg total) by mouth 3 (three) times daily., Disp: 90 tablet, Rfl: 1 Medication Side Effects: none  Family Medical/ Social History: Changes? No  MENTAL HEALTH EXAM:  There were no vitals taken for this visit.There is no height or weight on file to calculate BMI.  General Appearance: Casual, Neat, and Well Groomed  Eye Contact:  Good  Speech:  Clear and Coherent  Volume:  Normal  Mood:  NA  Affect:  Appropriate  Thought Process:  Coherent  Orientation:  Full (Time, Place, and Person)  Thought Content: Logical   Suicidal Thoughts:  No  Homicidal Thoughts:  No  Memory:   WNL  Judgement:  Good  Insight:  Good  Psychomotor Activity:  Normal  Concentration:  Concentration: Good  Recall:  Good  Fund of Knowledge: Good  Language: Good  Assets:  Desire for Improvement  ADL's:  Intact  Cognition: WNL  Prognosis:  Good    DIAGNOSES:    ICD-10-CM   1. Generalized anxiety disorder  F41.1 propranolol (INDERAL) 10 MG tablet    ALPRAZolam (XANAX) 0.5 MG tablet    2. Panic attacks  F41.0 propranolol (INDERAL) 10 MG tablet    ALPRAZolam (XANAX) 0.5 MG tablet      Receiving Psychotherapy: No    RECOMMENDATIONS:   Greater than 50% of  30 min face to face time with patient was spent on counseling and coordination of care. Still doing very well on current medications. Request no adjustments this visit.    Continue Inderal 10 mg 3 times daily as needed Continue xanax 0.5 mg, #15, once daily as needed only for severe uncontrolled anxiety.  To report worsening symptoms promptly Will follow up in 6 months to reassess. Provided emergency contact information Pt on [facility]   Discussed potential benefits, risk, and side effects of benzodiazepines to include potential risk of tolerance and dependence, as well as possible drowsiness.  Advised patient not to drive if experiencing drowsiness and to take lowest possible effective dose to minimize risk of dependence and tolerance.        Joan Flores, NP

## 2023-07-29 ENCOUNTER — Other Ambulatory Visit: Payer: Self-pay | Admitting: Behavioral Health

## 2023-07-29 DIAGNOSIS — F411 Generalized anxiety disorder: Secondary | ICD-10-CM

## 2023-07-29 DIAGNOSIS — F410 Panic disorder [episodic paroxysmal anxiety] without agoraphobia: Secondary | ICD-10-CM

## 2024-01-07 ENCOUNTER — Ambulatory Visit (INDEPENDENT_AMBULATORY_CARE_PROVIDER_SITE_OTHER): Payer: BC Managed Care – PPO | Admitting: Behavioral Health

## 2024-01-07 ENCOUNTER — Encounter: Payer: Self-pay | Admitting: Behavioral Health

## 2024-01-07 DIAGNOSIS — F410 Panic disorder [episodic paroxysmal anxiety] without agoraphobia: Secondary | ICD-10-CM | POA: Diagnosis not present

## 2024-01-07 DIAGNOSIS — F411 Generalized anxiety disorder: Secondary | ICD-10-CM

## 2024-01-07 MED ORDER — ALPRAZOLAM 0.5 MG PO TABS: ORAL_TABLET | ORAL | 1 refills | Status: DC

## 2024-01-07 MED ORDER — PROPRANOLOL HCL 10 MG PO TABS: 10.0000 mg | ORAL_TABLET | Freq: Three times a day (TID) | ORAL | 1 refills | Status: DC

## 2024-01-07 NOTE — Progress Notes (Signed)
[facility]  Patient ID: Sheila Brown,  MRN: [id number]  PCP: Patient, No Pcp Per  Date of Evaluation: 01/07/2024 Time spent:30 minutes  Chief Complaint:  Chief Complaint   Anxiety; Depression; Follow-up; Patient Education; Medication Refill     HISTORY/CURRENT STATUS: HPI  23 year old female presents to this office for follow up and medication management.  No psychosocial changes since last visit.  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 She says that she was doing very well and only use medication sparingly. Says that she is currently getting psychotherapy from [facility]. Has been doing well in relationship with boyfriend. Talking of engagement.  Reports anxiety 2/10 today Depression 1/10. Sleeping 7-8 hours . She does not want to adjust medication at this time. She currently denies any mania. No psychosis, No SI/HI. She would like a 6 month follow up.    No prior medication trials     Individual Medical History/ Review of Systems: Changes? :No   Allergies: Patient has no known allergies.  Current Medications:  Current Outpatient Medications:    ALPRAZolam (XANAX) 0.5 MG tablet, Take one tablet daily as needed for severe anxiety, Disp: 15 tablet, Rfl: 1   Norgestimate-Ethinyl Estradiol Triphasic 0.18/0.215/0.25 MG-25 MCG tab, Take 1 tablet by mouth daily., Disp: , Rfl:    propranolol (INDERAL) 10 MG tablet, Take 1 tablet (10 mg total) by mouth 3 (three) times daily., Disp: 270 tablet, Rfl: 1 Medication Side Effects: none  Family Medical/ Social History: Changes? No  MENTAL HEALTH EXAM:  There were no vitals taken for this visit.There is no height or weight on file to calculate BMI.  General Appearance: Casual, Neat, and Well Groomed  Eye Contact:  Good  Speech:  Clear and Coherent  Volume:  Normal  Mood:  NA  Affect:  Appropriate  Thought Process:  Coherent  Orientation:  Full (Time, Place, and Person)  Thought Content: Logical   Suicidal Thoughts:   No  Homicidal Thoughts:  No  Memory:  WNL  Judgement:  Good  Insight:  Good  Psychomotor Activity:  Normal  Concentration:  Concentration: Good  Recall:  Good  Fund of Knowledge: Good  Language: Good  Assets:  Desire for Improvement  ADL's:  Intact  Cognition: WNL  Prognosis:  Good    DIAGNOSES:    ICD-10-CM   1. Generalized anxiety disorder  F41.1 propranolol (INDERAL) 10 MG tablet    ALPRAZolam (XANAX) 0.5 MG tablet    2. Panic attacks  F41.0 propranolol (INDERAL) 10 MG tablet    ALPRAZolam (XANAX) 0.5 MG tablet      Receiving Psychotherapy: No    RECOMMENDATIONS:   Greater than 50% of  30 min face to face time with patient was spent on counseling and coordination of care. Still doing very well on current medications. Request no adjustments this visit.    Continue Inderal 10 mg 3 times daily as needed Continue xanax 0.5 mg, #15, once daily as needed only for severe uncontrolled anxiety.  To report worsening symptoms promptly Will follow up in 6 months to reassess. Provided emergency contact information Pt on BC   Discussed potential benefits, risk, and side effects of benzodiazepines to include potential risk of tolerance and dependence, as well as possible drowsiness.  Advised patient not to drive if experiencing drowsiness and to take lowest possible effective dose to minimize risk of dependence and tolerance.     Joan Flores, NP

## 2024-07-06 ENCOUNTER — Ambulatory Visit: Payer: BC Managed Care – PPO | Admitting: Behavioral Health

## 2024-07-06 ENCOUNTER — Encounter: Payer: Self-pay | Admitting: Behavioral Health

## 2024-07-06 DIAGNOSIS — F411 Generalized anxiety disorder: Secondary | ICD-10-CM | POA: Diagnosis not present

## 2024-07-06 DIAGNOSIS — F410 Panic disorder [episodic paroxysmal anxiety] without agoraphobia: Secondary | ICD-10-CM

## 2024-07-06 DIAGNOSIS — G4709 Other insomnia: Secondary | ICD-10-CM

## 2024-07-06 MED ORDER — HYDROXYZINE HCL 25 MG PO TABS: 25.0000 mg | ORAL_TABLET | Freq: Three times a day (TID) | ORAL | 1 refills | Status: AC | PRN

## 2024-07-06 MED ORDER — PROPRANOLOL HCL 10 MG PO TABS: 10.0000 mg | ORAL_TABLET | Freq: Three times a day (TID) | ORAL | 1 refills | Status: DC

## 2024-07-06 MED ORDER — ALPRAZOLAM 0.5 MG PO TABS: ORAL_TABLET | ORAL | 1 refills | Status: AC

## 2024-07-06 NOTE — Progress Notes (Signed)
 [facility]  Patient ID: Sheila Brown,  MRN: [id number]  PCP: Patient, No Pcp Per  Date of Evaluation: 07/06/2024 Time spent:30 minutes  Chief Complaint:  Chief Complaint   Anxiety; Follow-up; Medication Refill; Patient Education; Stress; Insomnia     HISTORY/CURRENT STATUS: HPI 23 year old female presents to this office for follow up and medication management.  No psychosocial changes since last visit.  Working long hours with her promotion at work. She is hoping things settle down. Wanting to move into new apartment without roommate.  She says that she was doing very well and only use medication sparingly. Says that she is currently getting psychotherapy from [facility]. Still in relationship with man in the [employer]. He lives in [place]. [place]. Has discussed engagement.  Reports anxiety 2/10 today Depression 1/10. Sleep has been challenging occasionally. She is open to medication to use as needed.  Using propranolol  sporadically.  She currently denies any mania. No psychosis, No SI/HI. She would like a 6 month follow up.    No prior medication trials Individual Medical History/ Review of Systems: Changes? :No   Allergies: Patient has no known allergies.  Current Medications:  Current Outpatient Medications:    hydrOXYzine  (ATARAX ) 25 MG tablet, Take 1 tablet (25 mg total) by mouth 3 (three) times daily as needed., Disp: 30 tablet, Rfl: 1   ALPRAZolam  (XANAX ) 0.5 MG tablet, Take one tablet daily as needed for severe anxiety, Disp: 15 tablet, Rfl: 1   Norgestimate-Ethinyl Estradiol Triphasic 0.18/0.215/0.25 MG-25 MCG tab, Take 1 tablet by mouth daily., Disp: , Rfl:    propranolol  (INDERAL ) 10 MG tablet, Take 1 tablet (10 mg total) by mouth 3 (three) times daily., Disp: 270 tablet, Rfl: 1 Medication Side Effects: none  Family Medical/ Social History: Changes? No  MENTAL HEALTH EXAM:  There were no vitals taken for this visit.There is no height or weight on file to  calculate BMI.  General Appearance: Casual and Well Groomed  Eye Contact:  Good  Speech:  Clear and Coherent  Volume:  Normal  Mood:  NA  Affect:  Appropriate  Thought Process:  Coherent  Orientation:  Full (Time, Place, and Person)  Thought Content: Logical   Suicidal Thoughts:  No  Homicidal Thoughts:  No  Memory:  WNL  Judgement:  Good  Insight:  Good  Psychomotor Activity:  Normal  Concentration:  Concentration: Good  Recall:  Good  Fund of Knowledge: Good  Language: Good  Assets:  Desire for Improvement  ADL's:  Intact  Cognition: WNL  Prognosis:  Good    DIAGNOSES:    ICD-10-CM   1. Other insomnia  G47.09 hydrOXYzine  (ATARAX ) 25 MG tablet    2. Generalized anxiety disorder  F41.1 hydrOXYzine  (ATARAX ) 25 MG tablet    propranolol  (INDERAL ) 10 MG tablet    ALPRAZolam  (XANAX ) 0.5 MG tablet    3. Panic attacks  F41.0 propranolol  (INDERAL ) 10 MG tablet    ALPRAZolam  (XANAX ) 0.5 MG tablet      Receiving Psychotherapy: No    RECOMMENDATIONS:  Greater than 50% of 30 min face to face time with patient was spent on counseling and coordination of care. Discussed non controlled medication for sleep PRN. She otherwise is doing very well and happy with her medication. Request no adjustments this visit.   Agreed to: To start hydroxyzine  25 mg three times daily as needed for anxiety or sleep.  Continue Inderal  10 mg 3 times daily as needed Continue xanax  0.5 mg, #15,  once daily as needed only for severe uncontrolled anxiety.  To report worsening symptoms promptly Will follow up in 6 months to reassess. Provided emergency contact information Pt on BC   Discussed potential benefits, risk, and side effects of benzodiazepines to include potential risk of tolerance and dependence, as well as possible drowsiness.  Advised patient not to drive if experiencing drowsiness and to take lowest possible effective dose to minimize risk of dependence and tolerance.      Redell DELENA Pizza,  NP

## 2025-01-06 ENCOUNTER — Ambulatory Visit: Admitting: Behavioral Health

## 2025-01-06 ENCOUNTER — Encounter: Payer: Self-pay | Admitting: Behavioral Health

## 2025-01-06 DIAGNOSIS — F411 Generalized anxiety disorder: Secondary | ICD-10-CM | POA: Diagnosis not present

## 2025-01-06 DIAGNOSIS — F410 Panic disorder [episodic paroxysmal anxiety] without agoraphobia: Secondary | ICD-10-CM

## 2025-01-06 MED ORDER — PROPRANOLOL HCL 10 MG PO TABS: 10.0000 mg | ORAL_TABLET | Freq: Three times a day (TID) | ORAL | 1 refills | Status: AC

## 2025-01-06 NOTE — Progress Notes (Signed)
 Sheila Brown [id number] 07/04/2001 23 y.o.  Virtual Visit via Telephone Note  I connected with pt on 01/06/25 at  8:00 AM EST by telephone and verified that I am speaking with the correct person using two identifiers.   I discussed the limitations, risks, security and privacy concerns of performing an evaluation and management service by telephone and the availability of in person appointments. I also discussed with the patient that there may be a patient responsible charge related to this service. The patient expressed understanding and agreed to proceed.   I discussed the assessment and treatment plan with the patient. The patient was provided an opportunity to ask questions and all were answered. The patient agreed with the plan and demonstrated an understanding of the instructions.   The patient was advised to call back or seek an in-person evaluation if the symptoms worsen or if the condition fails to improve as anticipated.  I provided 30 minutes of non-face-to-face time during this encounter.  The patient was located at home.  The provider was located at [facility].   Sheila Sheila Pizza, NP   Subjective:   Patient ID:  Sheila Brown is a 24 y.o. (DOB [birth year]) female.  Chief Complaint:  Chief Complaint  Patient presents with   Anxiety   Follow-up   Medication Refill   Patient Education    HPI Sheila Brown, 24 year old female presents to this office for follow up and medication management.  No psychosocial changes since last visit.  Still living alone in an apartment in [place].  She says that she was doing very well and only use medication sparingly. Says that she is currently getting psychotherapy from [facility].  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  Commutes back and forth to see him  Reports anxiety 2/10 today Depression 1/10. Sleep has been challenging occasionally. She is open to medication to use as needed.  Using propranolol  sporadically.  She  currently denies any mania. No psychosis, No SI/HI. She would like a 6 month follow up.    No prior medication trials    Review of Systems:  Review of Systems  Constitutional: Negative.   Allergic/Immunologic: Negative.   Neurological: Negative.     Medications: I have reviewed the patient's current medications.  Current Outpatient Medications  Medication Sig Dispense Refill   ALPRAZolam  (XANAX ) 0.5 MG tablet Take one tablet daily as needed for severe anxiety 15 tablet 1   hydrOXYzine  (ATARAX ) 25 MG tablet Take 1 tablet (25 mg total) by mouth 3 (three) times daily as needed. 30 tablet 1   Norgestimate-Ethinyl Estradiol Triphasic 0.18/0.215/0.25 MG-25 MCG tab Take 1 tablet by mouth daily.     propranolol  (INDERAL ) 10 MG tablet Take 1 tablet (10 mg total) by mouth 3 (three) times daily. 270 tablet 1   No current facility-administered medications for this visit.    Medication Side Effects: None  Allergies: Allergies[1]  Past Medical History:  Diagnosis Date   Anxiety    Complication of anesthesia    extreme anxiety r/t IV start. has had a procedure cancelled before due to hyperventilation etc    Family History  Problem Relation Age of Onset   Alcohol abuse Father    Depression Paternal Aunt    Anxiety disorder Paternal Aunt     Social History   Socioeconomic History   Marital status: Single    Spouse name: Not on file   Number of children: 0   Years of education: 89  Highest education level: Not on file  Occupational History    Comment: Just graduated and looking for new job.  Tobacco Use   Smoking status: Never   Smokeless tobacco: Never  Substance and Sexual Activity   Alcohol use: Yes    Comment: occas   Drug use: Never   Sexual activity: Not Currently    Birth control/protection: Pill  Other Topics Concern   Not on file  Social History Narrative   Lives with mom and dad in [place]. 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 Likes to do  art. [place].        Social Drivers of Health   Tobacco Use: Low Risk (07/06/2024)   Patient History    Smoking Tobacco Use: Never    Smokeless Tobacco Use: Never    Passive Exposure: Not on file  Financial Resource Strain: Not on file  Food Insecurity: Not on file  Transportation Needs: Not on file  Physical Activity: Not on file  Stress: Not on file  Social Connections: Not on file  Intimate Partner Violence: Not on file  Depression (EYV7-0): Not on file  Alcohol Screen: Not on file  Housing: Not on file  Utilities: Not on file  Health Literacy: Not on file    Past Medical History, Surgical history, Social history, and Family history were reviewed and updated as appropriate.   Please see review of systems for further details on the patient's review from today.   Objective:   Physical Exam:  There were no vitals taken for this visit.  Physical Exam Neurological:     Mental Status: She is alert and oriented to person, place, and time.  Psychiatric:        Attention and Perception: Attention and perception normal.        Mood and Affect: Mood normal.        Speech: Speech normal.        Behavior: Behavior normal. Behavior is cooperative.        Cognition and Memory: Cognition and memory normal.        Judgment: Judgment normal.     Comments: Insight intact     Lab Review:  No results found for: NA, K, CL, CO2, GLUCOSE, BUN, CREATININE, CALCIUM, PROT, ALBUMIN, AST, ALT, ALKPHOS, BILITOT, GFRNONAA, GFRAA  No results found for: WBC, RBC, HGB, HCT, PLT, MCV, MCH, MCHC, RDW, LYMPHSABS, MONOABS, EOSABS, BASOSABS  No results found for: POCLITH, LITHIUM   No results found for: PHENYTOIN, PHENOBARB, VALPROATE, CBMZ   .res Assessment: Plan:   Greater than 50% of 30 min telephonic time with patient was spent on counseling and coordination of care.  Discussed her current good  stability.  Discussed relationship dynamics and job stressors. She otherwise is doing very well and happy with her medication. Request no adjustments this visit.   Agreed to: Continue hydroxyzine  25 mg three times daily as needed for anxiety or sleep.  Continue Inderal  10 mg 3 times daily as needed Continue xanax  0.5 mg, #15, once daily as needed only for severe uncontrolled anxiety.  To report worsening symptoms promptly Will follow up in 6 months to reassess. Provided emergency contact information Pt on BC   Discussed potential benefits, risk, and side effects of benzodiazepines to include potential risk of tolerance and dependence, as well as possible drowsiness.  Advised patient not to drive if experiencing drowsiness and to take lowest possible effective dose to minimize risk of dependence and tolerance.  Trenna was seen today for anxiety, follow-up, medication refill and patient education.  Diagnoses and all orders for this visit:  Generalized anxiety disorder -     propranolol  (INDERAL ) 10 MG tablet; Take 1 tablet (10 mg total) by mouth 3 (three) times daily.  Panic attacks -     propranolol  (INDERAL ) 10 MG tablet; Take 1 tablet (10 mg total) by mouth 3 (three) times daily.    Please see After Visit Summary for patient specific instructions.  No future appointments.  No orders of the defined types were placed in this encounter.     -------------------------------    [1] No Known Allergies
